# Patient Record
Sex: Female | Born: 1960
Health system: Southern US, Community
[De-identification: ages and names within clinical notes are randomized; demographics above are authoritative.]

## PROBLEM LIST (undated history)

## (undated) DIAGNOSIS — Z973 Presence of spectacles and contact lenses: Secondary | ICD-10-CM

## (undated) DIAGNOSIS — R3915 Urgency of urination: Secondary | ICD-10-CM

## (undated) DIAGNOSIS — K5909 Other constipation: Secondary | ICD-10-CM

## (undated) DIAGNOSIS — R1031 Right lower quadrant pain: Secondary | ICD-10-CM

## (undated) DIAGNOSIS — K589 Irritable bowel syndrome without diarrhea: Secondary | ICD-10-CM

## (undated) DIAGNOSIS — K311 Adult hypertrophic pyloric stenosis: Secondary | ICD-10-CM

## (undated) DIAGNOSIS — Z8489 Family history of other specified conditions: Secondary | ICD-10-CM

## (undated) DIAGNOSIS — K219 Gastro-esophageal reflux disease without esophagitis: Secondary | ICD-10-CM

## (undated) DIAGNOSIS — M797 Fibromyalgia: Secondary | ICD-10-CM

## (undated) DIAGNOSIS — M1990 Unspecified osteoarthritis, unspecified site: Secondary | ICD-10-CM

## (undated) DIAGNOSIS — D649 Anemia, unspecified: Secondary | ICD-10-CM

## (undated) DIAGNOSIS — N644 Mastodynia: Secondary | ICD-10-CM

## (undated) DIAGNOSIS — R351 Nocturia: Secondary | ICD-10-CM

## (undated) DIAGNOSIS — R112 Nausea with vomiting, unspecified: Secondary | ICD-10-CM

## (undated) DIAGNOSIS — R011 Cardiac murmur, unspecified: Secondary | ICD-10-CM

## (undated) DIAGNOSIS — Z8639 Personal history of other endocrine, nutritional and metabolic disease: Secondary | ICD-10-CM

## (undated) DIAGNOSIS — K9041 Non-celiac gluten sensitivity: Secondary | ICD-10-CM

## (undated) DIAGNOSIS — R350 Frequency of micturition: Secondary | ICD-10-CM

## (undated) DIAGNOSIS — G43909 Migraine, unspecified, not intractable, without status migrainosus: Secondary | ICD-10-CM

## (undated) DIAGNOSIS — Z9889 Other specified postprocedural states: Secondary | ICD-10-CM

## (undated) DIAGNOSIS — N393 Stress incontinence (female) (male): Secondary | ICD-10-CM

## (undated) HISTORY — PX: BLADDER SUSPENSION: SHX72

## (undated) HISTORY — DX: Adult hypertrophic pyloric stenosis: K31.1

## (undated) HISTORY — DX: Gastro-esophageal reflux disease without esophagitis: K21.9

## (undated) HISTORY — DX: Non-celiac gluten sensitivity: K90.41

## (undated) HISTORY — DX: Right lower quadrant pain: R10.31

## (undated) HISTORY — DX: Cardiac murmur, unspecified: R01.1

## (undated) HISTORY — DX: Mastodynia: N64.4

## (undated) HISTORY — DX: Unspecified osteoarthritis, unspecified site: M19.90

## (undated) HISTORY — PX: TOTAL ABDOMINAL HYSTERECTOMY W/ BILATERAL SALPINGOOPHORECTOMY: SHX83

## (undated) HISTORY — DX: Fibromyalgia: M79.7

## (undated) HISTORY — DX: Irritable bowel syndrome, unspecified: K58.9

## (undated) HISTORY — DX: Anemia, unspecified: D64.9

## (undated) HISTORY — PX: COLONOSCOPY WITH PROPOFOL: SHX5780

---

## 1998-11-11 HISTORY — PX: BREAST CYST EXCISION: SHX579

## 2002-11-11 HISTORY — PX: CHOLECYSTECTOMY OPEN: SUR202

## 2007-09-14 ENCOUNTER — Emergency Department (HOSPITAL_COMMUNITY): Admission: EM | Admit: 2007-09-14 | Discharge: 2007-09-14 | Payer: Self-pay | Admitting: Emergency Medicine

## 2008-03-29 ENCOUNTER — Emergency Department (HOSPITAL_COMMUNITY): Admission: EM | Admit: 2008-03-29 | Discharge: 2008-03-29 | Payer: Self-pay | Admitting: Emergency Medicine

## 2010-05-16 ENCOUNTER — Emergency Department (HOSPITAL_COMMUNITY): Admission: EM | Admit: 2010-05-16 | Discharge: 2010-05-16 | Payer: Self-pay | Admitting: Emergency Medicine

## 2010-05-28 ENCOUNTER — Ambulatory Visit (HOSPITAL_COMMUNITY): Admission: RE | Admit: 2010-05-28 | Discharge: 2010-05-28 | Payer: Self-pay | Admitting: Family Medicine

## 2010-05-28 LAB — HM MAMMOGRAPHY: HM Mammogram: NEGATIVE

## 2010-05-30 ENCOUNTER — Ambulatory Visit: Payer: Self-pay | Admitting: Family Medicine

## 2011-01-27 LAB — RAPID STREP SCREEN (MED CTR MEBANE ONLY): Streptococcus, Group A Screen (Direct): NEGATIVE

## 2011-01-27 LAB — STREP A DNA PROBE

## 2011-04-06 ENCOUNTER — Emergency Department (HOSPITAL_COMMUNITY)
Admission: EM | Admit: 2011-04-06 | Discharge: 2011-04-06 | Disposition: A | Discharge status: Home / Self Care | Payer: Self-pay | Attending: Emergency Medicine | Admitting: Emergency Medicine

## 2011-04-06 ENCOUNTER — Emergency Department (HOSPITAL_COMMUNITY): Payer: Self-pay

## 2011-04-06 DIAGNOSIS — X58XXXA Exposure to other specified factors, initial encounter: Secondary | ICD-10-CM | POA: Insufficient documentation

## 2011-04-06 DIAGNOSIS — IMO0002 Reserved for concepts with insufficient information to code with codable children: Secondary | ICD-10-CM | POA: Insufficient documentation

## 2011-04-06 DIAGNOSIS — G43909 Migraine, unspecified, not intractable, without status migrainosus: Secondary | ICD-10-CM | POA: Insufficient documentation

## 2011-05-22 ENCOUNTER — Encounter: Payer: Self-pay | Admitting: Family Medicine

## 2011-06-17 ENCOUNTER — Other Ambulatory Visit: Payer: Self-pay | Admitting: Family Medicine

## 2011-06-17 DIAGNOSIS — Z1231 Encounter for screening mammogram for malignant neoplasm of breast: Secondary | ICD-10-CM

## 2011-06-20 ENCOUNTER — Ambulatory Visit (HOSPITAL_COMMUNITY): Payer: Self-pay

## 2011-06-21 ENCOUNTER — Ambulatory Visit (HOSPITAL_COMMUNITY)
Admission: RE | Admit: 2011-06-21 | Discharge: 2011-06-21 | Disposition: A | Discharge status: Home / Self Care | Payer: Self-pay | Source: Ambulatory Visit | Attending: Family Medicine | Admitting: Family Medicine

## 2011-06-21 DIAGNOSIS — Z1231 Encounter for screening mammogram for malignant neoplasm of breast: Secondary | ICD-10-CM | POA: Insufficient documentation

## 2011-08-07 LAB — URINALYSIS, ROUTINE W REFLEX MICROSCOPIC
Bilirubin Urine: NEGATIVE
Ketones, ur: NEGATIVE
Protein, ur: NEGATIVE

## 2011-08-20 LAB — DIFFERENTIAL
Basophils Absolute: 0.1
Basophils Relative: 1
Eosinophils Relative: 1
Monocytes Absolute: 0.5
Monocytes Relative: 4

## 2011-08-20 LAB — BASIC METABOLIC PANEL
BUN: 14
CO2: 30
Calcium: 10.5
Creatinine, Ser: 0.72
GFR calc Af Amer: >60
GFR calc non Af Amer: >60
Glucose, Bld: 95
Sodium: 142

## 2011-08-20 LAB — CBC
HCT: 38.3
MCV: 87.5
RBC: 4.38
RDW: 13.9
WBC: 12.1 — ABNORMAL HIGH

## 2011-08-20 LAB — URINALYSIS, ROUTINE W REFLEX MICROSCOPIC
Bilirubin Urine: NEGATIVE
Specific Gravity, Urine: 1.013
pH: 7

## 2011-08-20 LAB — URINE CULTURE

## 2011-09-03 ENCOUNTER — Encounter: Payer: Self-pay | Admitting: Family Medicine

## 2011-09-03 ENCOUNTER — Ambulatory Visit (INDEPENDENT_AMBULATORY_CARE_PROVIDER_SITE_OTHER): Payer: Self-pay | Admitting: Family Medicine

## 2011-09-03 ENCOUNTER — Ambulatory Visit (HOSPITAL_COMMUNITY)
Admission: RE | Admit: 2011-09-03 | Discharge: 2011-09-03 | Disposition: A | Discharge status: Home / Self Care | Payer: Self-pay | Source: Ambulatory Visit | Attending: Family Medicine | Admitting: Family Medicine

## 2011-09-03 ENCOUNTER — Other Ambulatory Visit: Payer: Self-pay | Admitting: Family Medicine

## 2011-09-03 VITALS — BP 130/90 | HR 69 | Ht <= 58 in | Wt 143.0 lb

## 2011-09-03 DIAGNOSIS — R1031 Right lower quadrant pain: Secondary | ICD-10-CM

## 2011-09-03 DIAGNOSIS — K5900 Constipation, unspecified: Secondary | ICD-10-CM | POA: Insufficient documentation

## 2011-09-03 HISTORY — DX: Right lower quadrant pain: R10.31

## 2011-09-03 LAB — COMPREHENSIVE METABOLIC PANEL
ALT: 18 U/L (ref 0–35)
AST: 23 U/L (ref 0–37)
BUN: 12 mg/dL (ref 6–23)
Glucose, Bld: 93 mg/dL (ref 70–99)
Sodium: 142 mEq/L (ref 135–145)
Total Bilirubin: 0.4 mg/dL (ref 0.3–1.2)
Total Protein: 7.6 g/dL (ref 6.0–8.3)

## 2011-09-03 LAB — CBC WITH DIFFERENTIAL/PLATELET
Basophils Absolute: 0 10*3/uL (ref 0.0–0.1)
HCT: 40.6 % (ref 36.0–46.0)
Hemoglobin: 13.7 g/dL (ref 12.0–15.0)
Neutro Abs: 2.3 10*3/uL (ref 1.7–7.7)
Neutrophils Relative %: 42 % — ABNORMAL LOW (ref 43–77)
Platelets: 336 10*3/uL (ref 150–400)
RBC: 4.57 MIL/uL (ref 3.87–5.11)
RDW: 13.6 % (ref 11.5–15.5)
WBC: 5.5 10*3/uL (ref 4.0–10.5)

## 2011-09-03 MED ORDER — LACTULOSE 10 GM/15ML PO SOLN: 20.0000 g | Freq: Three times a day (TID) | ORAL | Status: AC

## 2011-09-03 NOTE — Assessment & Plan Note (Signed)
Patient with onset of diarrhea and then nausea and loss of appetite that seemed to be precipitated by a medial with chicken the patient has felt mildly ill since that time. Most likely result of viral gastroenteritis versus mild food poisoning. The patient's abdominal exam, while presenting with some right lower cartilage tenderness, is fairly benign. I am evaluating her today with a CBC with differential as well as a metabolic panel and a KUB. She has been 3 days without having a bowel movement and I believe that if we can help her with this she will likely have resolution of her abdominal pain. I discussed with her the use of fleets enema at home as well as lactulose. She has used milk of magnesia on one occasion since this episode of illness without a bowel movement. She is given instructions about her reasons to present to the ED or to call our practice, which include onset of fevers and or chills, intractable vomiting, or markedly worsening at sharp abdominal pain. I will call the patient at the telephone number she gives me [phone] with results of her studies, and we will plan followup at that point. 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 pylori. She will let most likely require some form of endoscopic followup.

## 2011-09-03 NOTE — Patient Instructions (Addendum)
Fue un Research officer, trade union.  Me alegro tenerle como paciente!  Para Environmental health practitioner y el estrenimiento, estoy mandando hacer algunos laboratorios, tambien una placa del abdomen.  Le llamo al [phone] [facility] [place].    Mande' una receta para Watt Climes Doreen Beam) a la Corporate investment banker de [employer].  Por favor tome 1 a 2 cucharadas (TABLESPOONS) cada 8 horas hasta que haga del bano.   Tambien puede usar un lavado (Fleets enema) sin receta, para ayudarle a ir al bano.   Si el dolor abdominal se agudiza o si tiene vomitos constantes o fiebres, recomiendo que vaya al departamento de emergencia o que llame a nuestro consultorio [phone].  Quiero verle de nuevo en 4 semanas, o antes si se le hacenecesario.  MAKE FOLLOW UP APPOINTMENT WITH DR Mauricio Po IN 4 TO 5 WEEKS.

## 2011-09-03 NOTE — Progress Notes (Signed)
Subjective:    Patient ID: Terri Montgomery, female    DOB: [birth year], 51 y.o.   MRN: [id number]  HPI visit conducted in Spanish. The patient is new to our practice, who comes to establish care as well as evaluated for abdominal pain. The patient reports that she had a long period of recurrent bouts of abdominal pain when living in [place] between the years of 1999 and 2006. She reports that she was hospitalized many times in that interval and had been referred to a surgeon for an intestinal operation. She does not know the nature of the surgery that she was recommended to have and her pain seems to improve and resolve with dietary changes, probiotics, and regular physical exercise.  Today she reports that she has had a waxing and waning abdominal pain in her right lower car drink for approximately the past month. The pain has become much worse in the past 3-4 days. Beginning on Friday, 19 October after eating some chicken, she began to experience this pain in her right lower car trip that was sharp and it did not radiate. She continued with diarrhea on that Friday and Saturday, October 20. Her stools were somewhat red in color at that time, which she had attributed to eating beets. She reports some subjective fever and decreased appetite since the 20th she has not had a bowel movement in about 3 days, and reports increasingly worsening nausea without vomiting. She has had no objective fever. She denies dysuria or polyuria, she denies hematuria. 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  Past medical history: The patient had been diagnosed with H. pylori and given treatment on multiple occasions in the past. She had EGD and colonoscopies done multiple times in the period between 1999 2006 in [place], and she does not know the exact dates of her most recent  endoscopic studies there.  Social history: The patient lives with her husband and moved to [place] in 2008 from [place]. She has never been a smoker nor does she drink alcohol. She tries to eat a very healthy diet and she has a laminated red meat. She eats a large amount of vegetables and also will eat chicken and fish and takes probiotics. She takes no prescription medications at this time.  Family history: The patient's mother has diabetes hypertension and hypercholesterolemia as well as heart disease. The patient's father had some form of prostate illness that is not prostate cancer. There are no family members with known cancers. The patient had a daughter who died at age 41 of a "heart attack" and was found on autopsy to have a colonic obstruction.  Review of Systems she reports some occasional musculoskeletal type of discomfort in her sternum that is associated with weather changes. She denies any diaphoresis or nausea or any radiation of pain to the chart left arm. She denies any personal history of heart disease or hypertension     Objective:   Physical Exam Well-appearing, pleasant and in no apparent distress able to in the leg without any difficulty and stand up straight without apparent abdominal distress. HEENT. Mucous membranes are moist neck is supple no cervical adenopathy is noted. Clear oropharynx. Heart: Regular rate and rhythm no extra sounds or murmurs are noted. Lungs: Clear to auscultation bilaterally, no wheezes rales or rhonchi heard. Abdomen: Soft, no masses noted, no guarding. The patient  does have some tenderness in her right lower car drink that is not worsened or ameliorated by flexion of the hip negative obturator sign. The patient does have a normal bowel sounds on auscultation. There is no organomegaly noted. Extremities: Patient has no edema in her lower extremities        Assessment & Plan:

## 2011-09-04 ENCOUNTER — Encounter: Payer: Self-pay | Admitting: Family Medicine

## 2011-09-05 LAB — H. PYLORI BREATH TEST: H. pylori UBiT: NOT DETECTED

## 2011-10-23 ENCOUNTER — Encounter: Payer: Self-pay | Admitting: Family Medicine

## 2011-10-23 ENCOUNTER — Ambulatory Visit (INDEPENDENT_AMBULATORY_CARE_PROVIDER_SITE_OTHER): Payer: Self-pay | Admitting: Family Medicine

## 2011-10-23 DIAGNOSIS — R5383 Other fatigue: Secondary | ICD-10-CM | POA: Insufficient documentation

## 2011-10-23 DIAGNOSIS — N644 Mastodynia: Secondary | ICD-10-CM

## 2011-10-23 DIAGNOSIS — N6452 Nipple discharge: Secondary | ICD-10-CM

## 2011-10-23 DIAGNOSIS — N6459 Other signs and symptoms in breast: Secondary | ICD-10-CM

## 2011-10-23 DIAGNOSIS — R5381 Other malaise: Secondary | ICD-10-CM

## 2011-10-23 HISTORY — DX: Mastodynia: N64.4

## 2011-10-23 LAB — TSH: TSH: 1.645 u[IU]/mL (ref 0.350–4.500)

## 2011-10-23 LAB — CBC
Hemoglobin: 13.5 g/dL (ref 12.0–15.0)
Platelets: 322 10*3/uL (ref 150–400)
RBC: 4.44 MIL/uL (ref 3.87–5.11)
WBC: 6 10*3/uL (ref 4.0–10.5)

## 2011-10-23 LAB — PROLACTIN: Prolactin: 5.5 ng/mL

## 2011-10-23 MED ORDER — LORATADINE 10 MG PO TABS: 10.0000 mg | ORAL_TABLET | Freq: Every day | ORAL | Status: DC

## 2011-10-23 NOTE — Patient Instructions (Signed)
Fue un Research officer, trade union.  Estamos haciendo unos cuantos estudios (mamografia bilateral, Proofreader) y examenes de sangre para tratar de determinar la causa para el desecho y la comezon en los senos.   Por ahora puede tomar un antihistaminico (loratadine 10mg ) una tableta por dia, para la comezon.  tambien puede aplicar hydrocortisone 1% cream (over the counter) una a [employer] por dia, al area [employer].   Le llamo con los [employer].  Si no hay mejoria ni hallazgos que nos ayuden con el diagnostico, entonces pienso hacer una biopsia aqui en la oficina de la piel enrojecida cuando regrese en enero.  FOLLOW UP WITH DR Mauricio Po IN  January 2013.

## 2011-10-23 NOTE — Assessment & Plan Note (Signed)
Patient able to express discharge from R nipple during visit.  Guaiac testing on yellow fluid is negative.  Associated with pruritic red patches along bilateral breasts, and nipples, as well as exquisitely sensitive/tender breasts (moreso on the L than R).  H/o TAH (?BSO) in [place] 10 yrs ago for "fibromas", no malignant indications.  No family hx breast cancer.  Will send for bilat diagnostic mammogram and [place]; labs to evaluation for galactorrhea (prolactin, TSH, as well as FSH).  Low-dose topical steroid for possible eczema as cause of pruritus.  If studies negative and no clear etiology is found, then to perform skin biopsy on non-areolar skin when she comes back in January.  No true diplopia or visual field defect noted on today's exam; will consider more thorough eval of visual fields if prolactinoma is suspected after testing.

## 2011-10-23 NOTE — Progress Notes (Signed)
Addended by: Jennette Bill on: 10/23/2011 03:05 PM   Modules accepted: Orders

## 2011-10-23 NOTE — Progress Notes (Signed)
  Subjective:    Patient ID: Terri Montgomery, female    DOB: [birth year], 50 y.o.   MRN: [id number]  HPI    Review of Systems     Objective:   Physical Exam Well appearing, no apparent distress. HEENT NEck supple,. No neck masses or cervical adenopathy.  BREASTS: Nipples (bilaterally) with erythema, slight flaking.  Macules (3-19mm diameter) of nonraised erythema noted along both breasts, overlapping areolar and non-areolar skin.  No discreet mass noted on bimanual exam; exquisite tenderness along L breast ext/inferior quadrant. No axillary adenopathy noted.  Patient expresses small amount yellow nonpurulent fluid from R breast; guaiac testing is negative.       Assessment & Plan:

## 2011-10-24 ENCOUNTER — Telehealth: Payer: Self-pay | Admitting: *Deleted

## 2011-10-24 NOTE — Telephone Encounter (Signed)
Called and informed patient of appt for digital mammogram and U/S with the breast center on 11/07/11 at 8 am.Terri Montgomery, Rodena Medin

## 2011-10-28 ENCOUNTER — Encounter: Payer: Self-pay | Admitting: Family Medicine

## 2011-10-31 ENCOUNTER — Encounter: Payer: Self-pay | Admitting: Family Medicine

## 2011-10-31 ENCOUNTER — Ambulatory Visit (INDEPENDENT_AMBULATORY_CARE_PROVIDER_SITE_OTHER): Payer: Self-pay | Admitting: Family Medicine

## 2011-10-31 VITALS — BP 114/75 | HR 60 | Temp 98.0°F | Ht 62.0 in | Wt 146.0 lb

## 2011-10-31 DIAGNOSIS — R300 Dysuria: Secondary | ICD-10-CM

## 2011-10-31 DIAGNOSIS — R6889 Other general symptoms and signs: Secondary | ICD-10-CM

## 2011-10-31 LAB — POCT URINALYSIS DIPSTICK
Glucose, UA: NEGATIVE
Leukocytes, UA: NEGATIVE
Nitrite, UA: NEGATIVE
Protein, UA: NEGATIVE
Urobilinogen, UA: 0.2

## 2011-10-31 LAB — POCT UA - MICROSCOPIC ONLY

## 2011-10-31 MED ORDER — HYDROCODONE-ACETAMINOPHEN 5-500 MG PO TABS: 1.0000 | ORAL_TABLET | ORAL | Status: DC | PRN

## 2011-10-31 NOTE — Assessment & Plan Note (Signed)
Patient with symptoms consistent with flu. Normal O2 sat, no fever. Asked patient to try to maintain hydration, take Tylenol for pain. Patient is having a lot of pain in her chest back and legs and she requests pain medicine. I gave her a limited amount of Vicodin for this. I advised her to return if her breathing worsens or she is not getting better by Monday.

## 2011-10-31 NOTE — Assessment & Plan Note (Signed)
Mild dysuria today. We'll check a urine to make sure no infection.

## 2011-10-31 NOTE — Progress Notes (Signed)
  Subjective:    Patient ID: Terri Montgomery, female    DOB: Oct 09, 1961, 50 y.o.   MRN: [id number]  HPI Patient presents today with 5 days body aches, chest congestion, fever. She states that she feels a little bit short of breath and has some pleuritic chest pain. She denies cough. She reports sore throat. She states that she has considerable pain in her legs, back, chest. She had some dysuria this morning.   Review of Systems    denies diarrhea, nausea, vomiting Objective:   Physical Exam O2 sat 100% GEN-fatigue, uncomfortable appearing Lungs-clear to auscultation, no wheeze no rhonchi no crackles Heart-normal S1-S2 no murmur Abdomen-nontender nondistended Extremities-trace pitting edema bilateral ankles. Mild tenderness in the left calf. Calves are symmetric at 33.5 cm bilaterally     Assessment & Plan:

## 2011-10-31 NOTE — Patient Instructions (Addendum)
I am giving you some pain medicine today. If things are getting worse and not better over the next several days, I want you to make sure to come back. If your breathing gets worse I would also like you to come back I would like you to take Tylenol for the next couple of days. If you take a Vicodin, that has 500 mg of Tylenol and it You could take one or 2 Vicodin per day and take 650 mg of Tylenol morning and night Please try to stay hydrated I will call you if anything shows up in your urine  Influenza A (H1N1) H1N1 formerly called "swine flu" is a new influenza virus causing sickness in people. The H1N1 virus is different from seasonal influenza viruses. However, the H1N1 symptoms are similar to seasonal influenza and it is spread from person to person. You may be at higher risk for serious problems if you have underlying serious medical conditions. The CDC and the [employer] are following reported cases around the world. CAUSES    The flu is thought to spread mainly person-to-person through coughing or sneezing of infected people.     A person may become infected by touching something with the virus on it and then touching their mouth or nose.  SYMPTOMS    Fever.     Headache.    Tiredness.    Cough.    Sore throat.     Runny or stuffy nose.     Body aches.     Diarrhea and vomiting  These symptoms are referred to as "flu-like symptoms." A lot of different illnesses, including the common cold, may have similar symptoms. DIAGNOSIS    There are tests that can tell if you have the H1N1 virus.     Confirmed cases of H1N1 will be reported to the state or local health department.     A doctor's exam may be needed to tell whether you have an infection that is a complication of the flu.  HOME CARE INSTRUCTIONS    Stay informed. Visit the [facility] website for current recommendations. Visit [employer]. You may also call 1-800-CDC-INFO (503-011-8919).      Get help early if you develop any of the above symptoms.     If you are at high risk from complications of the flu, talk to your caregiver as soon as you develop flu-like symptoms. Those at higher risk for complications include:     People 65 years or older.     People with chronic medical conditions.     Pregnant women.     Young children.     Your caregiver may recommend antiviral medicine to help treat the flu.     If you get the flu, get plenty of rest, drink enough water and fluids to keep your urine clear or pale yellow, and avoid using alcohol or tobacco.     You may take over-the-counter medicine to relieve the symptoms of the flu if your caregiver approves. (Never give aspirin to children or teenagers who have flu-like symptoms, particularly fever).  TREATMENT   If you do get sick, antiviral drugs are available. These drugs can make your illness milder and make you feel better faster. Treatment should start soon after illness starts. It is only effective if taken within the first day of becoming ill. Only your caregiver can prescribe antiviral medication.   PREVENTION    Cover your nose and mouth with a tissue or your arm when you  cough or sneeze. Throw the tissue away.     Wash your hands often with soap and warm water, especially after you cough or sneeze. Alcohol-based cleaners are also effective against germs.     Avoid touching your eyes, nose or mouth. This is one way germs spread.     Try to avoid contact with sick people. Follow public health advice regarding school closures. Avoid crowds.     Stay home if you get sick. Limit contact with others to keep from infecting them. People infected with the H1N1 virus may be able to infect others anywhere from 1 day before feeling sick to 5-7 days after getting flu symptoms.     An H1N1 vaccine is available to help protect against the virus. In addition to the H1N1 vaccine, you will need to be vaccinated for seasonal  influenza. The H1N1 and seasonal vaccines may be given on the same day. The CDC especially recommends the H1N1 vaccine for:     Pregnant women.     People who live with or care for children younger than 65 months of age.     Health care and emergency services personnel.     Persons between the ages of 25 months through 46 years of age.     People from ages 32 through 6 years who are at higher risk for H1N1 because of chronic health disorders or immune system problems.  FACEMASKS In community and home settings, the use of facemasks and N95 respirators are not normally recommended. In certain circumstances, a facemask or N95 respirator may be used for persons at increased risk of severe illness from influenza. Your caregiver can give additional recommendations for facemask use. IN CHILDREN, EMERGENCY WARNING SIGNS THAT NEED URGENT MEDICAL CARE:  Fast breathing or trouble breathing.     Bluish skin color.     Not drinking enough fluids.     Not waking up or not interacting normally.     Being so fussy that the child does not want to be held.     Your child has an oral temperature above 102 F (38.9 C), not controlled by medicine.     Your baby is older than 3 months with a rectal temperature of 102 F (38.9 C) or higher.     Your baby is 18 months old or younger with a rectal temperature of 100.4 F (38 C) or higher.     Flu-like symptoms improve but then return with fever and worse cough.  IN ADULTS, EMERGENCY WARNING SIGNS THAT NEED URGENT MEDICAL CARE:  Difficulty breathing or shortness of breath.     Pain or pressure in the chest or abdomen.     Sudden dizziness.     Confusion.    Severe or persistent vomiting.     Bluish color.     You have a oral temperature above 102 F (38.9 C), not controlled by medicine.     Flu-like symptoms improve but return with fever and worse cough.  SEEK IMMEDIATE MEDICAL CARE IF:   You or someone you know is experiencing any of the  above symptoms. When you arrive at the emergency center, report that you think you have the flu. You may be asked to wear a mask and/or sit in a secluded area to protect others from getting sick. MAKE SURE YOU:    Understand these instructions.     Will watch your condition.     Will get help right away if you are not  doing well or get worse.  Some of this information courtesy of the CDC.  Document Released: 04/15/2008 Document Revised: 07/10/2011 Document Reviewed: 04/15/2008 [facility] Patient Information 2012 [place], [place].

## 2011-11-07 ENCOUNTER — Other Ambulatory Visit: Payer: Self-pay

## 2011-11-20 ENCOUNTER — Other Ambulatory Visit: Payer: Self-pay | Admitting: Family Medicine

## 2011-11-20 ENCOUNTER — Ambulatory Visit
Admission: RE | Admit: 2011-11-20 | Discharge: 2011-11-20 | Disposition: A | Discharge status: Home / Self Care | Payer: Self-pay | Source: Ambulatory Visit | Attending: Family Medicine | Admitting: Family Medicine

## 2011-11-20 DIAGNOSIS — N6452 Nipple discharge: Secondary | ICD-10-CM

## 2011-11-22 ENCOUNTER — Telehealth: Payer: Self-pay | Admitting: Family Medicine

## 2011-11-22 NOTE — Telephone Encounter (Signed)
Called patient to give results of diagnostic mammogram which is negative.  She reports continued redness; the itching is much better than before.  Feels out of sorts, somewhat dizzy.  Previously on bioidentical hormones, felt better when she was on these. To schedule appointment for follow up to discuss.

## 2011-12-02 ENCOUNTER — Telehealth: Payer: Self-pay | Admitting: *Deleted

## 2011-12-02 ENCOUNTER — Encounter: Payer: Self-pay | Admitting: Family Medicine

## 2011-12-02 ENCOUNTER — Ambulatory Visit (INDEPENDENT_AMBULATORY_CARE_PROVIDER_SITE_OTHER): Payer: Self-pay | Admitting: Family Medicine

## 2011-12-02 VITALS — BP 123/86 | HR 70 | Temp 98.3°F | Ht 62.0 in | Wt 144.8 lb

## 2011-12-02 DIAGNOSIS — R5381 Other malaise: Secondary | ICD-10-CM

## 2011-12-02 DIAGNOSIS — R5383 Other fatigue: Secondary | ICD-10-CM

## 2011-12-02 DIAGNOSIS — R0789 Other chest pain: Secondary | ICD-10-CM

## 2011-12-02 LAB — CBC WITH DIFFERENTIAL/PLATELET
Basophils Absolute: 0 10*3/uL (ref 0.0–0.1)
Eosinophils Relative: 1 % (ref 0–5)
HCT: 40.2 % (ref 36.0–46.0)
Hemoglobin: 13.6 g/dL (ref 12.0–15.0)
Lymphocytes Relative: 45 % (ref 12–46)
Lymphs Abs: 3.3 10*3/uL (ref 0.7–4.0)
MCV: 88.2 fL (ref 78.0–100.0)
Monocytes Absolute: 0.5 10*3/uL (ref 0.1–1.0)
Monocytes Relative: 7 % (ref 3–12)
Neutro Abs: 3.6 10*3/uL (ref 1.7–7.7)
RDW: 13.4 % (ref 11.5–15.5)
WBC: 7.4 10*3/uL (ref 4.0–10.5)

## 2011-12-02 MED ORDER — TRAMADOL HCL 50 MG PO TABS: 50.0000 mg | ORAL_TABLET | Freq: Three times a day (TID) | ORAL | Status: AC | PRN

## 2011-12-02 NOTE — Telephone Encounter (Signed)
Patient calls reporting on 01/18 in AM she developed chest pressure. Has continued over weekend with this off and on. Has cough with clear sputum, nausea, chills and sweating about every 3 hours she states.  State she has not had fever. Denies chest pressure now. Consulted with Dr. Deirdre Priest and he advises for patient to come in to be seen here this afternoon. Appointment scheduled.

## 2011-12-02 NOTE — Patient Instructions (Signed)
We are going to test a great deal of blood work today. It will come back in the next few days. Once it's back I will give you a call In the meantime take the Tramadol 50 mg every 6 hours as needed for pain.

## 2011-12-03 LAB — COMPREHENSIVE METABOLIC PANEL
ALT: 23 U/L (ref 0–35)
BUN: 10 mg/dL (ref 6–23)
CO2: 29 mEq/L (ref 19–32)
Calcium: 11.2 mg/dL — ABNORMAL HIGH (ref 8.4–10.5)
Chloride: 105 mEq/L (ref 96–112)
Creat: 0.69 mg/dL (ref 0.50–1.10)
Total Bilirubin: 0.3 mg/dL (ref 0.3–1.2)

## 2011-12-03 LAB — VITAMIN D 25 HYDROXY (VIT D DEFICIENCY, FRACTURES): Vit D, 25-Hydroxy: 14 ng/mL — ABNORMAL LOW (ref 30–89)

## 2011-12-03 LAB — PTH, INTACT AND CALCIUM
Calcium, Total (PTH): 11.2 mg/dL — ABNORMAL HIGH (ref 8.4–10.5)
PTH: 272.2 pg/mL — ABNORMAL HIGH (ref 14.0–72.0)

## 2011-12-03 LAB — C-REACTIVE PROTEIN: CRP: 0.06 mg/dL (ref <0.60)

## 2011-12-03 LAB — GAMMA GT: GGT: 15 U/L (ref 7–51)

## 2011-12-04 ENCOUNTER — Telehealth: Payer: Self-pay | Admitting: Family Medicine

## 2011-12-04 DIAGNOSIS — R0789 Other chest pain: Secondary | ICD-10-CM | POA: Insufficient documentation

## 2011-12-04 NOTE — Telephone Encounter (Signed)
Called and discussed results of lab work with patient.  Recommended next step as referral to general surgery. Patient states she has been talking with her mom and parathyroid problems run in her family.  Will place referral now

## 2011-12-04 NOTE — Assessment & Plan Note (Signed)
Not typical cardiac chest pain. Has not recurred after the one episode.  Has history of chest discomfort as well as abdominal discomfort in past. Reassured patient. Perhaps fibromyalgia, although I think something else is going on after reviewing her labs.   Will call with further lab results.

## 2011-12-04 NOTE — Progress Notes (Signed)
  Subjective:    Patient ID: Terri Montgomery, female    DOB: 07-May-1961, 51 y.o.   MRN: [id number]  HPI 1.  Malaise:  Patient has been experiencing malaise for past several months but feels it is acutely worsening in past several weeks to days.  Has been seen at [facility] several times recently for general flu-like symptoms, chest pains, cold sweats.  Although she is no longer having any URI symptoms she does continue to have vague abdominal complaints (sometimes diffuse cramping pain), nausea without vomiting, cold sweats during day.  Complains of chills, no fevers.  Cold sweats during day.  Fatigue persistent for past 3 months at least.    2.  Chest pressure:  Had episode of "pressure" in her chest Saturday evening while trying to fall asleep.  States she felt pressure that lasted 1-2 minutes and then resolved.  No further episodes.  No nausea, vomiting, diaphoresis, shortness of breath at that time.  She does endorse dyspnea on exertion when walking for distance, however no chest pain on exertion.  Had eaten about 2 hours earlier.  She is worried it is related to her fatigue symptoms.     Review of Systems See HPI above for review of systems.       Objective:   Physical Exam Gen:  Patient sitting on exam table, appears worried, fatigued HEENT:  [place]/AT.  EOMI, PERRL.  MMM, tonsils non-erythematous, non-edematous.  External ears WNL, Bilateral TM's normal without retraction, redness or bulging.  Neck:  No thyromegaly or masses palpated Cardiac:  Regular rate and rhythm without murmur auscultated.  Good S1/S2. Pulm:  Clear to auscultation bilaterally with good air movement.  No wheezes or rales noted.   Abd:  Soft/nontender.  No masses noted.  BS good throughout. Ext:  No clubbing/cyanosis/erythema.  No edema noted bilateral lower extremities.   Psych:  Appears depressed and fatigued.   Neuro:  No gross focal deficits noted.        Assessment & Plan:

## 2011-12-04 NOTE — Assessment & Plan Note (Signed)
I am concerned this symptoms have continued to persist.   Review of labs shows persistently elevated alk phos and elevated calcium. She has had cholecystectomy; I am concerned source of alk phos is from bone. Plan to obtain GGT to further elucidate this as well as parathyroid hormone, vit D level, CMET, TSH, CBC with diff as she has history of lymphocytosis in recent past as well.  ** After further investigation, she does state she has brother who had parathyroidectomy, and then mentions she has several siblings who have been told they have trouble with their parathyroids.   Await labs.  Will call and discuss details with her.

## 2011-12-09 ENCOUNTER — Telehealth: Payer: Self-pay | Admitting: *Deleted

## 2011-12-09 NOTE — Telephone Encounter (Signed)
I received the approval from [facility] for the service that was referred to [facility] 12/18/2011 @ 2:30pm w/ Dr. Katheren Shams. Patient was informed of this appointment and was given address, phone number, and informed to arrive at least 15 min in advance. Pat[place] was informed to bring orange card and amount due if not 100% covered. Patient informed me that card covers only 80% and understands she will owe money at visit.   [facility] [address] phone number [phone]

## 2011-12-16 ENCOUNTER — Encounter (HOSPITAL_COMMUNITY): Payer: Self-pay | Admitting: Emergency Medicine

## 2011-12-16 ENCOUNTER — Other Ambulatory Visit: Payer: Self-pay

## 2011-12-16 ENCOUNTER — Encounter (HOSPITAL_COMMUNITY): Payer: Self-pay

## 2011-12-16 ENCOUNTER — Emergency Department (INDEPENDENT_AMBULATORY_CARE_PROVIDER_SITE_OTHER)
Admission: EM | Admit: 2011-12-16 | Discharge: 2011-12-16 | Disposition: A | Discharge status: Nursing Home (Includes ICF) | Payer: Commercial Managed Care - PPO | Source: Home / Self Care

## 2011-12-16 ENCOUNTER — Ambulatory Visit (INDEPENDENT_AMBULATORY_CARE_PROVIDER_SITE_OTHER): Payer: Self-pay | Admitting: Surgery

## 2011-12-16 ENCOUNTER — Emergency Department (HOSPITAL_COMMUNITY)
Admission: EM | Admit: 2011-12-16 | Discharge: 2011-12-16 | Disposition: A | Discharge status: Home / Self Care | Payer: Commercial Managed Care - PPO | Attending: Emergency Medicine | Admitting: Emergency Medicine

## 2011-12-16 DIAGNOSIS — R5381 Other malaise: Secondary | ICD-10-CM

## 2011-12-16 DIAGNOSIS — R112 Nausea with vomiting, unspecified: Secondary | ICD-10-CM | POA: Insufficient documentation

## 2011-12-16 DIAGNOSIS — R634 Abnormal weight loss: Secondary | ICD-10-CM | POA: Insufficient documentation

## 2011-12-16 DIAGNOSIS — K219 Gastro-esophageal reflux disease without esophagitis: Secondary | ICD-10-CM | POA: Insufficient documentation

## 2011-12-16 DIAGNOSIS — R109 Unspecified abdominal pain: Secondary | ICD-10-CM

## 2011-12-16 DIAGNOSIS — R110 Nausea: Secondary | ICD-10-CM

## 2011-12-16 DIAGNOSIS — R5383 Other fatigue: Secondary | ICD-10-CM | POA: Insufficient documentation

## 2011-12-16 DIAGNOSIS — R079 Chest pain, unspecified: Secondary | ICD-10-CM | POA: Insufficient documentation

## 2011-12-16 DIAGNOSIS — R0789 Other chest pain: Secondary | ICD-10-CM

## 2011-12-16 DIAGNOSIS — R531 Weakness: Secondary | ICD-10-CM

## 2011-12-16 DIAGNOSIS — R197 Diarrhea, unspecified: Secondary | ICD-10-CM | POA: Insufficient documentation

## 2011-12-16 LAB — URINALYSIS, ROUTINE W REFLEX MICROSCOPIC
Bilirubin Urine: NEGATIVE
Glucose, UA: NEGATIVE mg/dL
Specific Gravity, Urine: 1.006 (ref 1.005–1.030)

## 2011-12-16 LAB — URINE MICROSCOPIC-ADD ON

## 2011-12-16 LAB — COMPREHENSIVE METABOLIC PANEL
ALT: 14 U/L (ref 0–35)
AST: 20 U/L (ref 0–37)
Albumin: 4 g/dL (ref 3.5–5.2)
CO2: 25 mEq/L (ref 19–32)
Calcium: 11.1 mg/dL — ABNORMAL HIGH (ref 8.4–10.5)
Creatinine, Ser: 0.63 mg/dL (ref 0.50–1.10)
GFR calc non Af Amer: >90 mL/min (ref >90)
Sodium: 141 mEq/L (ref 135–145)
Total Protein: 6.7 g/dL (ref 6.0–8.3)

## 2011-12-16 LAB — CBC
HCT: 37.5 % (ref 36.0–46.0)
Hemoglobin: 12.9 g/dL (ref 12.0–15.0)
MCH: 29.9 pg (ref 26.0–34.0)
MCHC: 34.4 g/dL (ref 30.0–36.0)
RBC: 4.32 MIL/uL (ref 3.87–5.11)

## 2011-12-16 LAB — DIFFERENTIAL
Eosinophils Absolute: 0.1 10*3/uL (ref 0.0–0.7)
Lymphs Abs: 3.9 10*3/uL (ref 0.7–4.0)
Monocytes Absolute: 0.5 10*3/uL (ref 0.1–1.0)
Monocytes Relative: 6 % (ref 3–12)
Neutro Abs: 3.7 10*3/uL (ref 1.7–7.7)
Neutrophils Relative %: 45 % (ref 43–77)

## 2011-12-16 MED ORDER — SODIUM CHLORIDE 0.9 % IV BOLUS (SEPSIS)
1000.0000 mL | Freq: Once | INTRAVENOUS | Status: AC
  Administered 2011-12-16: 1000 mL via INTRAVENOUS

## 2011-12-16 MED ORDER — DIPHENOXYLATE-ATROPINE 2.5-0.025 MG PO TABS: 1.0000 | ORAL_TABLET | Freq: Four times a day (QID) | ORAL | Status: DC | PRN

## 2011-12-16 MED ORDER — ONDANSETRON HCL 4 MG/2ML IJ SOLN
4.0000 mg | Freq: Once | INTRAMUSCULAR | Status: AC
  Administered 2011-12-16: 4 mg via INTRAVENOUS

## 2011-12-16 MED ORDER — METOCLOPRAMIDE HCL 10 MG PO TABS: ORAL_TABLET | ORAL | Status: DC

## 2011-12-16 MED ORDER — ONDANSETRON HCL 4 MG/2ML IJ SOLN
INTRAMUSCULAR | Status: AC
  Filled 2011-12-16: qty 2

## 2011-12-16 MED ORDER — MORPHINE SULFATE 4 MG/ML IJ SOLN
4.0000 mg | Freq: Once | INTRAMUSCULAR | Status: AC
  Administered 2011-12-16: 4 mg via INTRAVENOUS
  Filled 2011-12-16: qty 1

## 2011-12-16 MED ORDER — SODIUM CHLORIDE 0.9 % IV BOLUS (SEPSIS)
1000.0000 mL | Freq: Once | INTRAVENOUS | Status: DC
  Administered 2011-12-16: 1000 mL via INTRAVENOUS

## 2011-12-16 MED ORDER — GLYCOPYRROLATE 0.2 MG/ML IJ SOLN
0.2000 mg | Freq: Once | INTRAMUSCULAR | Status: AC
  Administered 2011-12-16: 0.2 mg via INTRAVENOUS
  Filled 2011-12-16 (×2): qty 1

## 2011-12-16 MED ORDER — ONDANSETRON HCL 4 MG/2ML IJ SOLN
4.0000 mg | Freq: Once | INTRAMUSCULAR | Status: AC
  Administered 2011-12-16: 4 mg via INTRAVENOUS
  Filled 2011-12-16: qty 2

## 2011-12-16 NOTE — ED Notes (Signed)
Pharmacy called for Robinul states is in major Pyxis

## 2011-12-16 NOTE — ED Provider Notes (Signed)
Medical screening examination/treatment/procedure(s) were performed by non-physician practitioner and as supervising physician I was immediately available for consultation/collaboration.  Hillery Hunter, MD 12/16/11 [id number]

## 2011-12-16 NOTE — ED Notes (Signed)
Pt is aware that we need to collect a urine specimen. Pt complaining of pain at IV site. Nurse informed.

## 2011-12-16 NOTE — ED Provider Notes (Cosign Needed Addendum)
History     CSN: [id number]  Arrival date & time 12/16/11  1533   First MD Initiated Contact with Patient 12/16/11 1837      Chief Complaint  Patient presents with  . Chest Pain    (Consider location/radiation/quality/duration/timing/severity/associated sxs/prior treatment) Patient is a 51 y.o. female presenting with chest pain. The history is provided by the patient.  Chest Pain Primary symptoms include fatigue, abdominal pain and nausea. Pertinent negatives for primary symptoms include no fever, no shortness of breath, no cough, no vomiting and no dizziness.    the patient is a 51 year old, female, with a history of GERD, and hypertrophic pyloric stenosis.  She presents with a several month history of fatigue, nausea without vomiting and diarrhea.  She has intermittent abdominal pain.  She denies urinary tract symptoms.  She denies chest pain, cough, or shortness of breath.  She has had about a 3 pound weight loss.  She was seen by her physician in the family practice Center, and they noted that she had a elevated calcium level.  She has an appointment with a surgeon next Wednesday.  Past Medical History  Diagnosis Date  . Hypertrophic pyloric stenosis   . GERD (gastroesophageal reflux disease)     History reviewed. No pertinent past surgical history.  No family history on file.  History  Substance Use Topics  . Smoking status: Never Smoker   . Smokeless tobacco: Not on file  . Alcohol Use: No    OB History    Grav Para Term Preterm Abortions TAB SAB Ect Mult Living                  Review of Systems  Constitutional: Positive for fatigue. Negative for fever and chills.  Eyes: Negative for photophobia and visual disturbance.  Respiratory: Negative for cough and shortness of breath.   Cardiovascular: Negative for chest pain.  Gastrointestinal: Positive for nausea and abdominal pain. Negative for vomiting and diarrhea.  Genitourinary: Positive for frequency. Negative  for dysuria and hematuria.  Musculoskeletal: Negative for back pain.  Neurological: Negative for dizziness, light-headedness and headaches.  Psychiatric/Behavioral: Negative for confusion.  All other systems reviewed and are negative.    Allergies  Demerol; Imitrex; Iodine; and Penicillins  Home Medications  No current outpatient prescriptions on file.  BP 137/88  Pulse 60  Temp(Src) 97.9 F (36.6 C) (Oral)  Resp 12  SpO2 100%  Physical Exam  Vitals reviewed. Constitutional: She is oriented to person, place, and time. She appears well-developed and well-nourished.  HENT:  Head: Normocephalic and atraumatic.  Eyes: Pupils are equal, round, and reactive to light.  Neck: Normal range of motion. No thyromegaly present.  Cardiovascular: Normal rate, regular rhythm and normal heart sounds.   No murmur heard. Pulmonary/Chest: Effort normal and breath sounds normal. No respiratory distress. She has no wheezes. She has no rales.  Abdominal: Soft. She exhibits no distension and no mass. There is tenderness. There is no rebound and no guarding.       Mild diffuse abdominal tenderness without peritoneal signs  Musculoskeletal: Normal range of motion. She exhibits no edema and no tenderness.  Neurological: She is alert and oriented to person, place, and time. No cranial nerve deficit.  Skin: Skin is warm and dry. No rash noted. No erythema.  Psychiatric: She has a normal mood and affect. Her behavior is normal.    ED Course  Procedures (including critical care time) 51 year old female presents to the emergency department with fatigue  and nausea.  She hasn't had a recent change in her insulin dosages.  She did not eat lunch today, but she didn't take her insulin and oral hypoglycemic agent.  She has had a cough for the past few days, but she has not had chest pain, vomiting, diarrhea, or urinary tract symptoms.  She has a history of a cardiac stent, and [facility].   Labs  Reviewed  COMPREHENSIVE METABOLIC PANEL  CBC  DIFFERENTIAL  URINALYSIS, ROUTINE W REFLEX MICROSCOPIC   No results found.   No diagnosis found.  Now she is nauseated.  Will give zofran.  10:54 PM Charge nurse told me she is in severe pain when he went to discharge her.  She says she does NOT want more narcotics.  She looks very uncomfortable. Abdomen soft. No acute abdoman.  Will give glycopyrolate.  MDM  Hypercalcemia - not significantly elevated- no ed tx indicated Nausea Abdominal pain. No acute abdomen.          Nicholes Stairs, MD 12/16/11 2224  Nicholes Stairs, MD 12/16/11 2255

## 2011-12-16 NOTE — ED Notes (Signed)
Has multiple c/o related to her parathyroid problem; per old chart, she  has been told she needs surgery to have the parathyroid removed, and has an  appt to see surgeon on Wednesday this week. Has been feeling weak, nauseated, hist of GERD, vomits frequently, even w small frequent meals, c/o pain across left chest and left scapular area and into neck/throat; states her daughter died at age 52 related to thyroid/heart issues; D Garnette Czech notified

## 2011-12-16 NOTE — ED Notes (Signed)
Pt sent to ED from Urgent Care with multi. Complaints.  Pt c/o pain in left chest,  Generalized abd pain with nausea and vomiting x's 2 weeks st's worse today.

## 2011-12-16 NOTE — ED Provider Notes (Signed)
History     CSN: [id number]  Arrival date & time 12/16/11  1326   None     Chief Complaint  Patient presents with  . Thyroid Problem    (Consider location/radiation/quality/duration/timing/severity/associated sxs/prior treatment) HPI Comments: Pt states she has been having N/V, epigastric abdominal pain and feeling very weak for 2 weeks. She was recently diagnosed with hyperparathyroidism and referred to a surgeon. Her appt is this week. She has had intermittent Lt chest discomfort and today is having burning pain in Lt chest and shoulder area. She had diarrhea 3 days last week. Appetite is decreased, and can eat only small amounts at a time. These symptoms are all ongoing.    Past Medical History  Diagnosis Date  . Hypertrophic pyloric stenosis   . GERD (gastroesophageal reflux disease)     History reviewed. No pertinent past surgical history.  History reviewed. No pertinent family history.  History  Substance Use Topics  . Smoking status: Never Smoker   . Smokeless tobacco: Not on file  . Alcohol Use: Not on file    OB History    Grav Para Term Preterm Abortions TAB SAB Ect Mult Living                  Review of Systems  Constitutional: Positive for appetite change and fatigue. Negative for fever and chills.  HENT: Negative for ear pain, sore throat and rhinorrhea.   Respiratory: Negative for cough, shortness of breath and wheezing.   Cardiovascular: Positive for chest pain. Negative for palpitations.  Gastrointestinal: Positive for nausea, vomiting, abdominal pain and diarrhea.  Genitourinary: Negative for dysuria and frequency.  Neurological: Positive for weakness.    Allergies  Demerol; Imitrex; and Iodine  Home Medications   Current Outpatient Rx  Name Route Sig Dispense Refill  . HYDROCODONE-ACETAMINOPHEN 5-500 MG PO TABS Oral Take 1 tablet by mouth every 4 (four) hours as needed for pain. 20 tablet 0  . LORATADINE 10 MG PO TABS Oral Take 1 tablet (10  mg total) by mouth daily. 30 tablet 2  . SOLUBLE FIBER/PROBIOTICS PO Oral Take by mouth.        BP 146/91  Pulse 72  Temp(Src) 97.4 F (36.3 C) (Oral)  Resp 18  SpO2 97%  Physical Exam  Nursing note and vitals reviewed. Constitutional: She appears well-developed and well-nourished. No distress.  HENT:  Head: Normocephalic and atraumatic.  Right Ear: Tympanic membrane, external ear and ear canal normal.  Left Ear: Tympanic membrane, external ear and ear canal normal.  Nose: Nose normal.  Mouth/Throat: Uvula is midline, oropharynx is clear and moist and mucous membranes are normal. No oropharyngeal exudate, posterior oropharyngeal edema or posterior oropharyngeal erythema.  Neck: Neck supple.  Cardiovascular: Normal rate, regular rhythm and normal heart sounds.   Pulmonary/Chest: Effort normal and breath sounds normal. No respiratory distress. She exhibits tenderness (Lt superior chest wall).  Abdominal: Normal appearance and bowel sounds are normal. She exhibits no mass. There is no hepatosplenomegaly. There is tenderness in the epigastric area, left upper quadrant and left lower quadrant. There is no guarding and no CVA tenderness.  Lymphadenopathy:    She has no cervical adenopathy.  Neurological: She is alert.  Skin: Skin is warm and dry.  Psychiatric: She has a normal mood and affect.    ED Course  Procedures (including critical care time)  Labs Reviewed - No data to display No results found.   1. Chest pressure   2. Nausea, vomiting and  diarrhea   3. Abdominal pain   4. Weakness       MDM  EKG NSR. Pt transferred to [facility].       Melody Comas, [place] 12/16/11 1540

## 2011-12-16 NOTE — ED Notes (Signed)
Pt requested and was given a few ice chips per nurse.

## 2011-12-17 ENCOUNTER — Encounter: Payer: Self-pay | Admitting: Family Medicine

## 2011-12-18 ENCOUNTER — Encounter (INDEPENDENT_AMBULATORY_CARE_PROVIDER_SITE_OTHER): Payer: Self-pay | Admitting: Surgery

## 2011-12-18 ENCOUNTER — Ambulatory Visit (INDEPENDENT_AMBULATORY_CARE_PROVIDER_SITE_OTHER): Payer: Commercial Managed Care - PPO | Admitting: Surgery

## 2011-12-18 DIAGNOSIS — E213 Hyperparathyroidism, unspecified: Secondary | ICD-10-CM

## 2011-12-18 NOTE — Progress Notes (Signed)
  CC: Hyperparathyroidism HPI: We were asked to see this patient by her primary physician to evaluate for possible hyperparathyroidism. She is a generally healthy 51 year old lady has been having problems with abdominal pain, fatigue, and headaches. She is been at least once to the urgent care for her abdominal pain. As part of her evaluation she was found to have an elevated calcium of 11.1 and an elevated parathormone level of about 252. We were asked to see her to consider parathyroid surgery.  Of note is that at least two family members including her brother have had problems with parathyroid tumors. She is not clear on the exact diagnosis.   ROS: Be positive for hearing loss chills swallowing problems voice change visual problems chest pain leg swelling palpitations abdominal distention abdominal pain constipation nausea vomiting arthritis pains headaches status weakness and rash  MEDS: Current Outpatient Prescriptions  Medication Sig Dispense Refill  . Calcium-Magnesium-Vitamin D (CALCIUM MAGNESIUM PO) Take by mouth daily.      . Probiotic Product (PROBIOTIC PO) Take by mouth daily.         ALLERGIES:  Allergies  Allergen Reactions  . Demerol   . Imitrex (Sumatriptan Base)   . Iodine   . Penicillins      PE General: The patient is alert awake oriented and in no distress Neck: Neck is supple. Thyroid is normal. There are no masses. There is no adenopathy.  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 I have notified her primary care physician and they can treat her GI symptoms until we can get resolution about her  parathyroid disease.

## 2011-12-18 NOTE — Patient Instructions (Signed)
We will schedule a scan to evaluate her parathyroid gland. I have sent a note to your primary care physician about your intestinal symptoms and about her family history of parathyroid problems so they can make decisions about further evaluation.  We will call you as soon as we have a result on your scan.

## 2011-12-19 ENCOUNTER — Ambulatory Visit (HOSPITAL_COMMUNITY)
Admission: RE | Admit: 2011-12-19 | Discharge: 2011-12-19 | Disposition: A | Discharge status: Home / Self Care | Payer: Commercial Managed Care - PPO | Source: Ambulatory Visit | Attending: Family Medicine | Admitting: Family Medicine

## 2011-12-19 ENCOUNTER — Ambulatory Visit (INDEPENDENT_AMBULATORY_CARE_PROVIDER_SITE_OTHER): Payer: Self-pay | Admitting: Family Medicine

## 2011-12-19 ENCOUNTER — Encounter: Payer: Self-pay | Admitting: Family Medicine

## 2011-12-19 DIAGNOSIS — E210 Primary hyperparathyroidism: Secondary | ICD-10-CM

## 2011-12-19 DIAGNOSIS — R109 Unspecified abdominal pain: Secondary | ICD-10-CM | POA: Insufficient documentation

## 2011-12-19 DIAGNOSIS — R1110 Vomiting, unspecified: Secondary | ICD-10-CM | POA: Insufficient documentation

## 2011-12-19 MED ORDER — ONDANSETRON HCL 4 MG PO TABS: 4.0000 mg | ORAL_TABLET | Freq: Three times a day (TID) | ORAL | Status: AC | PRN

## 2011-12-19 MED ORDER — TRAMADOL HCL 50 MG PO TABS: 50.0000 mg | ORAL_TABLET | Freq: Three times a day (TID) | ORAL | Status: AC | PRN

## 2011-12-19 NOTE — Progress Notes (Signed)
  Subjective:    Patient ID: Terri Montgomery, female    DOB: December 08, 1960, 51 y.o.   MRN: [id number]  HPI 1.  Abd pain/N/V:  Patient with persistent abd pain.  Also complaining of nausea.  Several episodes of vomiting after eating since Saturday.  Went to Urgent Care due to pain on Tuesday and sent to the ER.  Was provided morphine which made her diaphoretic and more nauseous, also somewhat jittery.  Since leaving ER, has been worsening. Complaining of Right sided abdominal pain. Hungry but not able to tolerate much food.  Has been constipated x 3 days, diarrhea last week.  Very upset because of how poorly she feels.  N/V persists, 1-2 episodes a day.  No fevers or chills.  Vomiting occurs after eating. Feels she is able to tolerate by mouth liquids somewhat but unable to tolerate by mouth food  Seen by Surgery who set her up for scan which is a week from tomorrow.     Review of Systems See HPI above for review of systems.       Objective:   Physical Exam . Gen.: Patient lying on exam table in mild distress. Depressed appearing. Tearful. Neck:  No masses Cardiac:  Regular rate and rhythm without murmur auscultated.  Good S1/S2. Pulm:  Clear to auscultation bilaterally with good air movement.  No wheezes or rales noted.   Abd:  Right-sided upper and lower quadrant tenderness. Moderate in intensity. No guarding or rebound. Bowel sounds hypoactive. No pain or tenderness on left side.        Assessment & Plan:

## 2011-12-19 NOTE — Assessment & Plan Note (Signed)
Discuss this is likely secondary to hyperparathyroidism. However as she has had little bit of acute worsening in the past 3 days, vomiting, no bowel movements I want to make sure she does not have the beginnings of obstruction. She does have history of cholecystectomy. I will send for abdominal x-ray. Treat pain with tramadol. Treating vomiting with Zofran. I will see her back next week to assess for improvement. I will also call her with the x-ray results.

## 2011-12-19 NOTE — Patient Instructions (Signed)
We will get you set up for a belly xray. Take the Zofran for nausea. Take the Tramadol 1-2 tabs for pain relief. Come back and see me next week.

## 2011-12-19 NOTE — Assessment & Plan Note (Signed)
Now being followed by surgery. She has scan setup for next Friday.

## 2011-12-20 ENCOUNTER — Encounter (HOSPITAL_COMMUNITY): Payer: Self-pay | Admitting: *Deleted

## 2011-12-20 ENCOUNTER — Emergency Department (HOSPITAL_COMMUNITY)
Admission: EM | Admit: 2011-12-20 | Discharge: 2011-12-20 | Discharge status: Against Medical Advice | Payer: Commercial Managed Care - PPO | Attending: Emergency Medicine | Admitting: Emergency Medicine

## 2011-12-20 DIAGNOSIS — K5900 Constipation, unspecified: Secondary | ICD-10-CM | POA: Insufficient documentation

## 2011-12-20 NOTE — ED Notes (Signed)
Pt in c/o constipation since Monday, also pain with urination, and nausea

## 2011-12-20 NOTE — ED Notes (Signed)
Pt called to be room, pt no longer in lobby

## 2011-12-24 ENCOUNTER — Ambulatory Visit (HOSPITAL_COMMUNITY)
Admission: RE | Admit: 2011-12-24 | Discharge: 2011-12-24 | Disposition: A | Discharge status: Home / Self Care | Payer: Commercial Managed Care - PPO | Source: Ambulatory Visit | Attending: Surgery | Admitting: Surgery

## 2011-12-24 ENCOUNTER — Ambulatory Visit (HOSPITAL_COMMUNITY): Admission: RE | Admit: 2011-12-24 | Payer: Commercial Managed Care - PPO | Source: Ambulatory Visit

## 2011-12-24 ENCOUNTER — Encounter (HOSPITAL_COMMUNITY): Admission: RE | Admit: 2011-12-24 | Payer: Commercial Managed Care - PPO | Source: Ambulatory Visit

## 2011-12-24 ENCOUNTER — Inpatient Hospital Stay (HOSPITAL_COMMUNITY): Admission: RE | Admit: 2011-12-24 | Payer: Commercial Managed Care - PPO | Source: Ambulatory Visit

## 2011-12-24 ENCOUNTER — Other Ambulatory Visit (INDEPENDENT_AMBULATORY_CARE_PROVIDER_SITE_OTHER): Payer: Self-pay | Admitting: Surgery

## 2011-12-24 DIAGNOSIS — R946 Abnormal results of thyroid function studies: Secondary | ICD-10-CM | POA: Insufficient documentation

## 2011-12-24 MED ORDER — TECHNETIUM TC 99M SESTAMIBI - CARDIOLITE
25.0000 | Freq: Once | INTRAVENOUS | Status: AC | PRN
  Administered 2011-12-24: 11:00:00 25 via INTRAVENOUS

## 2011-12-24 MED ORDER — TECHNETIUM TC 99M SESTAMIBI - CARDIOLITE
25.0000 | Freq: Once | INTRAVENOUS | Status: AC | PRN
  Administered 2011-12-24: 25 via INTRAVENOUS

## 2011-12-27 ENCOUNTER — Encounter (HOSPITAL_COMMUNITY): Payer: Self-pay

## 2011-12-27 ENCOUNTER — Telehealth (INDEPENDENT_AMBULATORY_CARE_PROVIDER_SITE_OTHER): Payer: Self-pay | Admitting: General Surgery

## 2011-12-27 DIAGNOSIS — D351 Benign neoplasm of parathyroid gland: Secondary | ICD-10-CM

## 2011-12-27 NOTE — Telephone Encounter (Signed)
Patient called back, made her aware it did look like they were able to pinpoint on the scan a parathyroid gland that could be causing the problem. Made her aware I would need direction from Dr Jamey Ripa to advise on the next step. Made her aware we would call her back.

## 2011-12-27 NOTE — Telephone Encounter (Signed)
Patient called looking for results of her parathyroid scan. I called patient and left voicemail for patient to call me back.

## 2011-12-30 NOTE — Telephone Encounter (Signed)
The scan suggests there may be an abnormality within the thyroid gland. Will get thyroid ultrasound to see if abnormality in the gland can be detected

## 2012-01-01 NOTE — Telephone Encounter (Signed)
Addended byLiliana Cline on: 01/01/2012 03:47 PM   Modules accepted: Orders

## 2012-01-01 NOTE — Telephone Encounter (Signed)
Spoke with patient, made her aware we need to get an ultrasound. Order placed.

## 2012-01-02 ENCOUNTER — Ambulatory Visit
Admission: RE | Admit: 2012-01-02 | Discharge: 2012-01-02 | Disposition: A | Discharge status: Home / Self Care | Payer: Commercial Managed Care - PPO | Source: Ambulatory Visit | Attending: Surgery | Admitting: Surgery

## 2012-01-02 ENCOUNTER — Other Ambulatory Visit (INDEPENDENT_AMBULATORY_CARE_PROVIDER_SITE_OTHER): Payer: Self-pay | Admitting: Surgery

## 2012-01-02 ENCOUNTER — Telehealth (INDEPENDENT_AMBULATORY_CARE_PROVIDER_SITE_OTHER): Payer: Self-pay | Admitting: Surgery

## 2012-01-02 DIAGNOSIS — D351 Benign neoplasm of parathyroid gland: Secondary | ICD-10-CM

## 2012-01-02 NOTE — Telephone Encounter (Signed)
Her ultrasound shows what appears to be a parathyroid adenoma left superior. I talked with her and she would like to go ahead and schedule surgery. She will come to see me for a pre-op visit so we can review the details of the surgery

## 2012-01-06 ENCOUNTER — Other Ambulatory Visit: Payer: Commercial Managed Care - PPO

## 2012-01-07 ENCOUNTER — Encounter (HOSPITAL_COMMUNITY): Payer: Self-pay | Admitting: Pharmacy Technician

## 2012-01-09 ENCOUNTER — Encounter: Payer: Self-pay | Admitting: Family Medicine

## 2012-01-09 ENCOUNTER — Ambulatory Visit (INDEPENDENT_AMBULATORY_CARE_PROVIDER_SITE_OTHER): Payer: Commercial Managed Care - PPO | Admitting: Family Medicine

## 2012-01-09 VITALS — BP 125/81 | HR 60 | Temp 97.9°F | Ht 62.0 in | Wt 140.0 lb

## 2012-01-09 DIAGNOSIS — J029 Acute pharyngitis, unspecified: Secondary | ICD-10-CM

## 2012-01-09 NOTE — Progress Notes (Signed)
  Subjective:    Patient ID: Terri Montgomery, female    DOB: July 15, 1961, 51 y.o.   MRN: [id number]  HPI 2 day history of sore throat  No fever (99) but has felt cold.  Soreness when eating, also tender on left side of neck.  No rhinorrhea, cough.  Notes pain is moderate to severe.    PMH sig for left parathyroid adenoma, has surgery scheduled in mid march.  Has baseline symptoms of nausea, muscle aches, constipation due to primary hyperparathyroidism, notes no increase.  No dyspnea or inability to swallow. Review of Systems See HPI    Objective:   Physical Exam GEN: Alert & Oriented, No acute distress HEENT: [place]/AT. EOMI, PERRLA, no conjunctival injection or scleral icterus.  Bilateral tympanic membranes intact without erythema or effusion.  .  Nares without edema or rhinorrhea.  Oropharynx is without erythema or exudates. No mass effect or edema in posterior oropharynx No anterior or posterior cervical lymphadenopathy.  Thyroid nontender.  Left neck lateral to thyroid TTP, no obvious swelling or erythema. CV:  Regular Rate & Rhythm, no murmur, normal work of breathing.  No trismus or stridor. Respiratory:  Normal work of breathing, CTAB          Assessment & Plan:

## 2012-01-09 NOTE — Assessment & Plan Note (Signed)
Neg strep, will treat as viral pharyngitis with NSAIDs as most likely cause.    Given the severity of her pain and tenderness of left neck, advised for close follow-up and monitoring for symptoms for dysphagia, dyspnea, swelling, or fever to indicate possible infection/abscess or inflammation related to neck/left parathyroid adenoma.

## 2012-01-09 NOTE — Patient Instructions (Signed)
Strep test negative.  Will treat as viral pharyngitis (sore throat) with pain medications- high dose ibuprofen 800 mg three times a day  If you notice trouble breathing, trouble swallowing, or other concerns, please call

## 2012-01-14 ENCOUNTER — Encounter (HOSPITAL_COMMUNITY): Payer: Self-pay

## 2012-01-14 ENCOUNTER — Encounter (HOSPITAL_COMMUNITY)
Admission: RE | Admit: 2012-01-14 | Discharge: 2012-01-14 | Disposition: A | Discharge status: Home / Self Care | Payer: Commercial Managed Care - PPO | Source: Ambulatory Visit | Attending: Surgery | Admitting: Surgery

## 2012-01-14 HISTORY — DX: Other specified postprocedural states: Z98.890

## 2012-01-14 HISTORY — DX: Nausea with vomiting, unspecified: R11.2

## 2012-01-14 LAB — SURGICAL PCR SCREEN
MRSA, PCR: NEGATIVE
Staphylococcus aureus: NEGATIVE

## 2012-01-14 LAB — CBC
Hemoglobin: 13.4 g/dL (ref 12.0–15.0)
MCH: 30.3 pg (ref 26.0–34.0)
RBC: 4.42 MIL/uL (ref 3.87–5.11)

## 2012-01-14 NOTE — Pre-Procedure Instructions (Signed)
[address]  01/14/2012   Your procedure is scheduled on:  Tuesday, March 12  Report to [facility] at 5:30 AM.  Call this number if you have problems the morning of surgery: [phone]   Remember:   Do not eat food:After Midnight.  May have clear liquids: up to 4 Hours before arrival.  Clear liquids include soda, tea, black coffee, apple or grape juice, broth.  Take these medicines the morning of surgery with A SIP OF WATER: none   Do not wear jewelry, make-up or nail polish.  Do not wear lotions, powders, or perfumes. You may wear deodorant.  Do not shave 48 hours prior to surgery.  Do not bring valuables to the hospital.  Contacts, dentures or bridgework may not be worn into surgery.  Leave suitcase in the car. After surgery it may be brought to your room.  For patients admitted to the hospital, checkout time is 11:00 AM the day of discharge.   Patients discharged the day of surgery will not be allowed to drive home.  Name and phone number of your driver: NA  Special Instructions: CHG Shower Use Special Wash: 1/2 bottle night before surgery and 1/2 bottle morning of surgery.   Please read over the following fact sheets that you were given: Pain Booklet, Coughing and Deep Breathing and Surgical Site Infection Prevention

## 2012-01-15 ENCOUNTER — Ambulatory Visit (INDEPENDENT_AMBULATORY_CARE_PROVIDER_SITE_OTHER): Payer: Commercial Managed Care - PPO | Admitting: Surgery

## 2012-01-15 ENCOUNTER — Encounter (INDEPENDENT_AMBULATORY_CARE_PROVIDER_SITE_OTHER): Payer: Self-pay | Admitting: Surgery

## 2012-01-15 VITALS — BP 128/86 | HR 68 | Temp 97.1°F | Resp 12 | Ht 61.5 in | Wt 142.8 lb

## 2012-01-15 DIAGNOSIS — E210 Primary hyperparathyroidism: Secondary | ICD-10-CM

## 2012-01-15 NOTE — Patient Instructions (Signed)
Call me if you have any questions before surgery

## 2012-01-15 NOTE — Progress Notes (Signed)
  CC: Hyperparathyroidism HPI: We were asked to see this patient by her primary physician to evaluate for possible hyperparathyroidism. She is a generally healthy 51 year old lady has been having problems with abdominal pain, fatigue, and headaches. She is been at least once to the urgent care for her abdominal pain. As part of her evaluation she was found to have an elevated calcium of 11.1 and an elevated parathormone level of about 252. We were asked to see her to consider parathyroid surgery.  Of note is that at least two family members including her brother have had problems with parathyroid tumors. She is not clear on the exact diagnosis.  After her visit here we obtained both a parathyroid scan and a sono and the adenoma looks like it is on them left, probably a left superior gland   ROS: Be positive for hearing loss chills swallowing problems voice change visual problems chest pain leg swelling palpitations abdominal distention abdominal pain constipation nausea vomiting arthritis pains headaches status weakness and rash  MEDS: Current Outpatient Prescriptions  Medication Sig Dispense Refill  . ibuprofen (ADVIL,MOTRIN) 200 MG tablet Take 200 mg by mouth every 6 (six) hours as needed.         ALLERGIES:  Allergies  Allergen Reactions  . Demerol Shortness Of Breath  . Iodine Shortness Of Breath  . Penicillins Shortness Of Breath  . Imitrex (Sumatriptan Base) Other (See Comments)    Increases HA  . Morphine And Related Nausea And Vomiting     PE General: The patient is alert awake oriented and in no distress Neck: Neck is supple. Thyroid is normal. There are no masses. There is no adenopathy. Lungs: Normal respirations, clear to auscultation Heart: Reg no M,R,G. Abd: soft and no tender - no mass or organomegaly Data Reviewed I have reviewed with the notes from her primary care as well as the information in Epic Reviewed the scans as well  Assessment Probable  hyperparathyroidism likely secondary to parathyroid adenoma Possible familial history of hyperparathyroidism GI symptoms, fatigue, and generalized aches and pains may well be related to her hypercalcemia  Plan I have recommended parathyroid exploration for adenoma. Will try to do minimally invasive, Discussed risks and complications All questions answered.

## 2012-01-20 MED ORDER — CIPROFLOXACIN IN D5W 400 MG/200ML IV SOLN
400.0000 mg | INTRAVENOUS | Status: DC
  Filled 2012-01-20: qty 200

## 2012-01-21 ENCOUNTER — Encounter (HOSPITAL_COMMUNITY)
Admission: RE | Disposition: A | Discharge status: Home / Self Care | Payer: Self-pay | Source: Ambulatory Visit | Attending: Surgery

## 2012-01-21 ENCOUNTER — Ambulatory Visit (HOSPITAL_COMMUNITY)
Admission: RE | Admit: 2012-01-21 | Discharge: 2012-01-22 | Disposition: A | Discharge status: Home / Self Care | Payer: Commercial Managed Care - PPO | Source: Ambulatory Visit | Attending: Surgery | Admitting: Surgery

## 2012-01-21 ENCOUNTER — Ambulatory Visit (HOSPITAL_COMMUNITY): Payer: Commercial Managed Care - PPO | Admitting: Anesthesiology

## 2012-01-21 ENCOUNTER — Encounter (HOSPITAL_COMMUNITY): Payer: Self-pay | Admitting: *Deleted

## 2012-01-21 ENCOUNTER — Encounter (HOSPITAL_COMMUNITY): Payer: Self-pay | Admitting: Anesthesiology

## 2012-01-21 DIAGNOSIS — E213 Hyperparathyroidism, unspecified: Secondary | ICD-10-CM | POA: Insufficient documentation

## 2012-01-21 DIAGNOSIS — E210 Primary hyperparathyroidism: Secondary | ICD-10-CM

## 2012-01-21 DIAGNOSIS — Z01812 Encounter for preprocedural laboratory examination: Secondary | ICD-10-CM | POA: Insufficient documentation

## 2012-01-21 HISTORY — PX: PARATHYROIDECTOMY: SHX19

## 2012-01-21 SURGERY — PARATHYROIDECTOMY
Anesthesia: General | Wound class: Clean

## 2012-01-21 MED ORDER — MIDAZOLAM HCL 5 MG/5ML IJ SOLN
INTRAMUSCULAR | Status: DC | PRN
  Administered 2012-01-21: 2 mg via INTRAVENOUS

## 2012-01-21 MED ORDER — ACETAMINOPHEN 10 MG/ML IV SOLN
1000.0000 mg | Freq: Once | INTRAVENOUS | Status: AC
  Administered 2012-01-21: 1000 mg via INTRAVENOUS
  Filled 2012-01-21: qty 100

## 2012-01-21 MED ORDER — CIPROFLOXACIN IN D5W 400 MG/200ML IV SOLN
INTRAVENOUS | Status: DC | PRN
  Administered 2012-01-21: 400 mg via INTRAVENOUS

## 2012-01-21 MED ORDER — ROCURONIUM BROMIDE 100 MG/10ML IV SOLN
INTRAVENOUS | Status: DC | PRN
  Administered 2012-01-21: 40 mg via INTRAVENOUS

## 2012-01-21 MED ORDER — 0.9 % SODIUM CHLORIDE (POUR BTL) OPTIME
TOPICAL | Status: DC | PRN
  Administered 2012-01-21: 1000 mL

## 2012-01-21 MED ORDER — BUPIVACAINE HCL (PF) 0.25 % IJ SOLN
INTRAMUSCULAR | Status: DC | PRN
  Administered 2012-01-21: 6 mL

## 2012-01-21 MED ORDER — HYDROMORPHONE HCL PF 1 MG/ML IJ SOLN
0.2500 mg | INTRAMUSCULAR | Status: DC | PRN
  Administered 2012-01-21 (×5): 0.25 mg via INTRAVENOUS

## 2012-01-21 MED ORDER — ONDANSETRON HCL 4 MG PO TABS: 4.0000 mg | ORAL_TABLET | Freq: Four times a day (QID) | ORAL | Status: DC | PRN

## 2012-01-21 MED ORDER — FENTANYL CITRATE 0.05 MG/ML IJ SOLN
INTRAMUSCULAR | Status: DC | PRN
  Administered 2012-01-21: 100 ug via INTRAVENOUS
  Administered 2012-01-21: 50 ug via INTRAVENOUS

## 2012-01-21 MED ORDER — ONDANSETRON HCL 4 MG/2ML IJ SOLN: 4.0000 mg | Freq: Four times a day (QID) | INTRAMUSCULAR | Status: DC | PRN

## 2012-01-21 MED ORDER — SCOPOLAMINE 1 MG/3DAYS TD PT72
MEDICATED_PATCH | TRANSDERMAL | Status: DC | PRN
  Administered 2012-01-21: 1 via TRANSDERMAL

## 2012-01-21 MED ORDER — GLYCOPYRROLATE 0.2 MG/ML IJ SOLN
INTRAMUSCULAR | Status: DC | PRN
  Administered 2012-01-21: .8 mg via INTRAVENOUS

## 2012-01-21 MED ORDER — NEOSTIGMINE METHYLSULFATE 1 MG/ML IJ SOLN
INTRAMUSCULAR | Status: DC | PRN
  Administered 2012-01-21: 5 mg via INTRAVENOUS

## 2012-01-21 MED ORDER — LACTATED RINGERS IV SOLN
INTRAVENOUS | Status: DC | PRN
  Administered 2012-01-21 (×2): via INTRAVENOUS

## 2012-01-21 MED ORDER — DROPERIDOL 2.5 MG/ML IJ SOLN
INTRAMUSCULAR | Status: DC | PRN
  Administered 2012-01-21: 0.625 mg via INTRAVENOUS

## 2012-01-21 MED ORDER — PROPOFOL 10 MG/ML IV EMUL
INTRAVENOUS | Status: DC | PRN
  Administered 2012-01-21: 120 mg via INTRAVENOUS

## 2012-01-21 MED ORDER — PROMETHAZINE HCL 25 MG/ML IJ SOLN: 6.2500 mg | INTRAMUSCULAR | Status: DC | PRN

## 2012-01-21 MED ORDER — LIDOCAINE HCL 4 % MT SOLN
OROMUCOSAL | Status: DC | PRN
  Administered 2012-01-21: 4 mL via TOPICAL

## 2012-01-21 MED ORDER — ONDANSETRON HCL 4 MG/2ML IJ SOLN
INTRAMUSCULAR | Status: DC | PRN
  Administered 2012-01-21 (×2): 4 mg via INTRAVENOUS

## 2012-01-21 MED ORDER — DEXTROSE IN LACTATED RINGERS 5 % IV SOLN
INTRAVENOUS | Status: DC
  Administered 2012-01-21: 17:00:00 via INTRAVENOUS
  Administered 2012-01-22: 75 mL/h via INTRAVENOUS

## 2012-01-21 MED ORDER — HYDROCODONE-ACETAMINOPHEN 5-325 MG PO TABS
1.0000 | ORAL_TABLET | ORAL | Status: DC | PRN
  Administered 2012-01-21 – 2012-01-22 (×5): 2 via ORAL
  Filled 2012-01-21 (×5): qty 2

## 2012-01-21 MED ORDER — DEXAMETHASONE SODIUM PHOSPHATE 4 MG/ML IJ SOLN
INTRAMUSCULAR | Status: DC | PRN
  Administered 2012-01-21: 4 mg via INTRAVENOUS

## 2012-01-21 SURGICAL SUPPLY — 50 items
BLADE SURG 15 STRL LF DISP TIS (BLADE) ×1 IMPLANT
BLADE SURG 15 STRL SS (BLADE) ×1
BLADE SURG ROTATE 9660 (MISCELLANEOUS) IMPLANT
CANISTER SUCTION 2500CC (MISCELLANEOUS) ×2 IMPLANT
CHLORAPREP W/TINT 10.5 ML (MISCELLANEOUS) ×2 IMPLANT
CLIP TI MEDIUM 6 (CLIP) ×2 IMPLANT
CLIP TI WIDE RED SMALL 24 (CLIP) ×2 IMPLANT
CLOTH BEACON ORANGE TIMEOUT ST (SAFETY) ×2 IMPLANT
CONT SPEC 4OZ CLIKSEAL STRL BL (MISCELLANEOUS) ×2 IMPLANT
COVER SURGICAL LIGHT HANDLE (MISCELLANEOUS) ×2 IMPLANT
CRADLE DONUT ADULT HEAD (MISCELLANEOUS) ×2 IMPLANT
DECANTER SPIKE VIAL GLASS SM (MISCELLANEOUS) ×2 IMPLANT
DERMABOND ADVANCED (GAUZE/BANDAGES/DRESSINGS) ×1
DERMABOND ADVANCED .7 DNX12 (GAUZE/BANDAGES/DRESSINGS) ×1 IMPLANT
DRAPE PED LAPAROTOMY (DRAPES) ×2 IMPLANT
DRAPE UTILITY 15X26 W/TAPE STR (DRAPE) ×4 IMPLANT
ELECT CAUTERY BLADE 6.4 (BLADE) ×2 IMPLANT
ELECT REM PT RETURN 9FT ADLT (ELECTROSURGICAL) ×2
ELECTRODE REM PT RTRN 9FT ADLT (ELECTROSURGICAL) ×1 IMPLANT
GAUZE SPONGE 2X2 8PLY STRL LF (GAUZE/BANDAGES/DRESSINGS) IMPLANT
GAUZE SPONGE 4X4 16PLY XRAY LF (GAUZE/BANDAGES/DRESSINGS) ×2 IMPLANT
GLOVE BIOGEL PI IND STRL 6.5 (GLOVE) ×1 IMPLANT
GLOVE BIOGEL PI INDICATOR 6.5 (GLOVE) ×1
GLOVE EUDERMIC 7 POWDERFREE (GLOVE) ×2 IMPLANT
GLOVE SURG ORTHO 8.0 STRL STRW (GLOVE) ×2 IMPLANT
GLOVE SURG SS PI 6.5 STRL IVOR (GLOVE) ×2 IMPLANT
GLOVE SURG SS PI 7.0 STRL IVOR (GLOVE) ×2 IMPLANT
GOWN PREVENTION PLUS XLARGE (GOWN DISPOSABLE) IMPLANT
GOWN STRL NON-REIN LRG LVL3 (GOWN DISPOSABLE) ×4 IMPLANT
GOWN STRL REIN XL XLG (GOWN DISPOSABLE) ×2 IMPLANT
HEMOSTAT SURGICEL 2X4 FIBR (HEMOSTASIS) ×2 IMPLANT
KIT BASIN OR (CUSTOM PROCEDURE TRAY) ×2 IMPLANT
KIT ROOM TURNOVER OR (KITS) ×2 IMPLANT
NEEDLE HYPO 25GX1X1/2 BEV (NEEDLE) ×2 IMPLANT
NS IRRIG 1000ML POUR BTL (IV SOLUTION) ×2 IMPLANT
PACK SURGICAL SETUP 50X90 (CUSTOM PROCEDURE TRAY) ×2 IMPLANT
PAD ARMBOARD 7.5X6 YLW CONV (MISCELLANEOUS) ×4 IMPLANT
PENCIL BUTTON HOLSTER BLD 10FT (ELECTRODE) ×2 IMPLANT
SPONGE GAUZE 2X2 STER 10/PKG (GAUZE/BANDAGES/DRESSINGS)
SPONGE INTESTINAL PEANUT (DISPOSABLE) ×2 IMPLANT
STRIP CLOSURE SKIN 1/2X4 (GAUZE/BANDAGES/DRESSINGS) IMPLANT
SUT MNCRL AB 4-0 PS2 18 (SUTURE) ×2 IMPLANT
SUT SILK 3 0 (SUTURE) ×1
SUT SILK 3-0 18XBRD TIE 12 (SUTURE) ×1 IMPLANT
SUT VIC AB 3-0 SH 18 (SUTURE) ×2 IMPLANT
SYR BULB 3OZ (MISCELLANEOUS) ×2 IMPLANT
SYR CONTROL 10ML LL (SYRINGE) ×2 IMPLANT
TOWEL OR 17X24 6PK STRL BLUE (TOWEL DISPOSABLE) ×2 IMPLANT
TOWEL OR 17X26 10 PK STRL BLUE (TOWEL DISPOSABLE) ×2 IMPLANT
TUBE CONNECTING 12X1/4 (SUCTIONS) ×2 IMPLANT

## 2012-01-21 NOTE — Anesthesia Postprocedure Evaluation (Signed)
  Anesthesia Post-op Note  Patient: Terri Montgomery  Procedure(s) Performed: Procedure(s) (LRB): PARATHYROIDECTOMY (N/A)  Patient Location: PACU  Anesthesia Type: General  Level of Consciousness: awake  Airway and Oxygen Therapy: Patient Spontanous Breathing  Post-op Pain: mild  Post-op Assessment: Post-op Vital signs reviewed  Post-op Vital Signs: stable  Complications: No apparent anesthesia complications

## 2012-01-21 NOTE — Preoperative (Signed)
Beta Blockers   Reason not to administer Beta Blockers:Not Applicable 

## 2012-01-21 NOTE — OR Nursing (Signed)
Specimen received in pathology at 0807. Called received from [facility].

## 2012-01-21 NOTE — Progress Notes (Signed)
Patient's pain not relieved with Norco 2 tabs p.o. Dr. Janee Morn notified. Order for one dose of Tylenol I.V.

## 2012-01-21 NOTE — H&P (View-Only) (Signed)
  CC: Hyperparathyroidism HPI: We were asked to see this patient by her primary physician to evaluate for possible hyperparathyroidism. She is a generally healthy 50-year-old lady has been having problems with abdominal pain, fatigue, and headaches. She is been at least once to the urgent care for her abdominal pain. As part of her evaluation she was found to have an elevated calcium of 11.1 and an elevated parathormone level of about 252. We were asked to see her to consider parathyroid surgery.  Of note is that at least two family members including her brother have had problems with parathyroid tumors. She is not clear on the exact diagnosis.  After her visit here we obtained both a parathyroid scan and a sono and the adenoma looks like it is on them left, probably a left superior gland   ROS: Be positive for hearing loss chills swallowing problems voice change visual problems chest pain leg swelling palpitations abdominal distention abdominal pain constipation nausea vomiting arthritis pains headaches status weakness and rash  MEDS: Current Outpatient Prescriptions  Medication Sig Dispense Refill  . ibuprofen (ADVIL,MOTRIN) 200 MG tablet Take 200 mg by mouth every 6 (six) hours as needed.         ALLERGIES:  Allergies  Allergen Reactions  . Demerol Shortness Of Breath  . Iodine Shortness Of Breath  . Penicillins Shortness Of Breath  . Imitrex (Sumatriptan Base) Other (See Comments)    Increases HA  . Morphine And Related Nausea And Vomiting     PE General: The patient is alert awake oriented and in no distress Neck: Neck is supple. Thyroid is normal. There are no masses. There is no adenopathy. Lungs: Normal respirations, clear to auscultation Heart: Reg no M,R,G. Abd: soft and no tender - no mass or organomegaly Data Reviewed I have reviewed with the notes from her primary care as well as the information in Epic Reviewed the scans as well  Assessment Probable  hyperparathyroidism likely secondary to parathyroid adenoma Possible familial history of hyperparathyroidism GI symptoms, fatigue, and generalized aches and pains may well be related to her hypercalcemia  Plan I have recommended parathyroid exploration for adenoma. Will try to do minimally invasive, Discussed risks and complications All questions answered. 

## 2012-01-21 NOTE — Anesthesia Preprocedure Evaluation (Addendum)
Anesthesia Evaluation  Patient identified by MRN, date of birth, ID band Patient awake    Reviewed: Allergy & Precautions, H&P , NPO status , Patient's Chart, lab work & pertinent test results  History of Anesthesia Complications (+) PONV  Airway Mallampati: I  Neck ROM: Full    Dental  (+) Teeth Intact   Pulmonary  breath sounds clear to auscultation        Cardiovascular Rhythm:Regular Rate:Normal     Neuro/Psych    GI/Hepatic   Endo/Other    Renal/GU      Musculoskeletal   Abdominal   Peds  Hematology   Anesthesia Other Findings   Reproductive/Obstetrics                          Anesthesia Physical Anesthesia Plan  ASA: II  Anesthesia Plan: General   Post-op Pain Management:    Induction: Intravenous  Airway Management Planned: Oral ETT  Additional Equipment:   Intra-op Plan:   Post-operative Plan: Extubation in OR  Informed Consent: I have reviewed the patients History and Physical, chart, labs and discussed the procedure including the risks, benefits and alternatives for the proposed anesthesia with the patient or authorized representative who has indicated his/her understanding and acceptance.   Dental advisory given  Plan Discussed with: CRNA, Surgeon and Anesthesiologist  Anesthesia Plan Comments:        Anesthesia Quick Evaluation

## 2012-01-21 NOTE — Interval H&P Note (Signed)
History and Physical Interval Note:  01/21/2012 7:18 AM  Terri Montgomery  has presented today for surgery, with the diagnosis of parathyroid adenoma  The various methods of treatment have been discussed with the patient and family. After consideration of risks, benefits and other options for treatment, the patient has consented to  Procedure(s) (LRB): PARATHYROIDECTOMY (N/A) as a surgical intervention .  The patients' history has been reviewed, patient examined, no change in status, stable for surgery.  I have reviewed the patients' chart and labs.  Questions were answered to the patient's satisfaction.     Ashely Joshua J

## 2012-01-21 NOTE — Op Note (Signed)
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 Darnell Level Anesthesia: General   Clinical History and Indications: This patient has presented with signs and symptoms consistent with hyperparathyroidism. A scan has shown what appears to be a gland in the left superior thyroid pole vicinity consistent with a parathyroid adenoma. Parathyroidectomy was recommended.    Description of Procedure: I saw patient preoperative area and confirmed the plans for the procedure as noted above. She had no further questions. She was taken to the operating room and satisfactory general endotracheal anesthesia achieved. The neck was prepped and draped. A timeout was done.  A short transverse incision was made extending from the midline to the left. The subcutaneous tissues and platysma were divided and a subplatysmal flap raised. The midline was identified and opened and the strap muscles elevated off of the thyroid gland. With minimal dissection I was able to identify what appeared to be a superior parathyroid gland and this was surgically excised using clips on vessels. It was removed intact. It was sent for frozen section which was consistent with hyperplastic parathyroid tissue.  I made sure everything was dry I put Fibrillar cover the area. There was a 1 cm well-circumscribed benign feeling inferior thyroid pole nodule consistent with those seen on the ultrasound. I thought this was a hyperplastic nodule as she has other small nodules noted on ultrasound.  Incision was closed in layers with 3-0 Vicryl followed by 4-0 Monocryl subcuticular and Dermabond. The patient tolerated the procedure well. There were no operative complications. Counts were  correct.  Currie Paris, MD, FACS 01/21/2012 8:41 AM

## 2012-01-21 NOTE — Anesthesia Procedure Notes (Signed)
Procedure Name: Intubation Date/Time: 01/21/2012 7:33 AM Performed by: Glendora Score A Pre-anesthesia Checklist: Patient identified, Emergency Drugs available, Suction available and Patient being monitored Patient Re-evaluated:Patient Re-evaluated prior to inductionOxygen Delivery Method: Circle system utilized Preoxygenation: Pre-oxygenation with 100% oxygen Intubation Type: IV induction Ventilation: Mask ventilation without difficulty and Oral airway inserted - appropriate to patient size Laryngoscope Size: Hyacinth Meeker and 2 Grade View: Grade II Tube type: Oral Tube size: 7.5 mm Number of attempts: 1 Airway Equipment and Method: Stylet and LTA kit utilized Placement Confirmation: ETT inserted through vocal cords under direct vision,  positive ETCO2 and breath sounds checked- equal and bilateral Secured at: 22 cm Tube secured with: Tape Dental Injury: Teeth and Oropharynx as per pre-operative assessment

## 2012-01-21 NOTE — Transfer of Care (Signed)
Immediate Anesthesia Transfer of Care Note  Patient: Terri Montgomery  Procedure(s) Performed: Procedure(s) (LRB): PARATHYROIDECTOMY (N/A)  Patient Location: PACU  Anesthesia Type: General  Level of Consciousness: awake, alert , oriented and patient cooperative  Airway & Oxygen Therapy: Patient Spontanous Breathing and Patient connected to face mask oxygen  Post-op Assessment: Report given to PACU RN  Post vital signs: Reviewed and stable  Complications: No apparent anesthesia complications

## 2012-01-22 ENCOUNTER — Telehealth (INDEPENDENT_AMBULATORY_CARE_PROVIDER_SITE_OTHER): Payer: Self-pay | Admitting: General Surgery

## 2012-01-22 LAB — CALCIUM: Calcium: 9.9 mg/dL (ref 8.4–10.5)

## 2012-01-22 MED ORDER — HYDROCODONE-ACETAMINOPHEN 5-325 MG PO TABS: 1.0000 | ORAL_TABLET | ORAL | Status: AC | PRN

## 2012-01-22 NOTE — Progress Notes (Signed)
1 Day Post-Op  Subjective: Mild incisional pain this am. Otherwise no c/o  Objective: Vital signs in last 24 hours: Temp:  [97 F (36.1 C)-98 F (36.7 C)] 98 F (36.7 C) (03/13 0553) Pulse Rate:  [50-65] 63  (03/13 0553) Resp:  [16-18] 18  (03/13 0553) BP: (95-118)/(55-70) 95/55 mmHg (03/13 0553) SpO2:  [97 %-100 %] 97 % (03/13 0553) Weight:  [140 lb (63.504 kg)] 140 lb (63.504 kg) (03/12 1611)   Intake/Output from previous day: 03/12 0701 - 03/13 0700 In: 3500 [P.O.:720; I.V.:2780] Out: 3000 [Urine:3000] Intake/Output this shift:     General appearance: alert and no distress Resp: clear to auscultation bilaterally  Incision: healing well  Lab Results:  No results found for this basename: WBC:2,HGB:2,HCT:2,PLT:2 in the last 72 hours BMET  Basename 01/21/12 1551  NA --  K --  CL --  CO2 --  GLUCOSE --  BUN --  CREATININE --  CALCIUM 10.3   PT/INR No results found for this basename: LABPROT:2,INR:2 in the last 72 hours ABG No results found for this basename: PHART:2,PCO2:2,PO2:2,HCO3:2 in the last 72 hours  MEDS, Scheduled    . acetaminophen  1,000 mg Intravenous Once  . DISCONTD: ciprofloxacin  400 mg Intravenous 120 min pre-op    Studies/Results: No results found.  Assessment: s/p Procedure(s): PARATHYROIDECTOMY Doing well and able to go home. AM Ca++ still pending, 10.3 last night  Plan: Discharge   LOS: 1 day     Currie Paris, MD, [facility], [place] [phone]   01/22/2012 7:41 AM

## 2012-01-22 NOTE — Progress Notes (Signed)
UR of chart complete.  

## 2012-01-22 NOTE — Progress Notes (Signed)
Discharge home. Home discharge instruction given, no question verbalized. Alert and oriented, not in any distress, ambulatory.

## 2012-01-22 NOTE — Telephone Encounter (Signed)
Patient made aware of path results. Will follow up at appt and call with any questions prior.  

## 2012-01-22 NOTE — Telephone Encounter (Signed)
Message copied by Liliana Cline on Wed Jan 22, 2012  4:32 PM ------      Message from: Currie Paris      Created: Wed Jan 22, 2012  3:16 PM       Tell her path is benign and as expected

## 2012-01-23 ENCOUNTER — Telehealth (INDEPENDENT_AMBULATORY_CARE_PROVIDER_SITE_OTHER): Payer: Self-pay | Admitting: General Surgery

## 2012-01-23 NOTE — Telephone Encounter (Signed)
Patient called complaining of general weakness and pain. She states she feels weak to the point of almost passing out this am. Says she has had some labored breathing, and nausea. No vomiting. She is eating and drinking okay. No fevers. She is post parathyroidectomy on 01/21/12. States her symptoms just started this am. Incision looks okay. Please advise.

## 2012-01-23 NOTE — Telephone Encounter (Signed)
Spoke with Dr Johna Sheriff who advised patient to go to ER if breathing is very labored. To check calcium level to check on weakness. Patient had calcium level checked yesterday and it was 9.9. She was made aware that that is good. She will keep an eye on everything. Her breathing does not sound labored on the phone but patient was instructed to go to the ER if needed. To follow up at office visit and call if needed.

## 2012-01-27 ENCOUNTER — Encounter (HOSPITAL_COMMUNITY): Payer: Self-pay | Admitting: Surgery

## 2012-02-10 ENCOUNTER — Encounter (INDEPENDENT_AMBULATORY_CARE_PROVIDER_SITE_OTHER): Payer: Self-pay | Admitting: Surgery

## 2012-02-10 ENCOUNTER — Ambulatory Visit (INDEPENDENT_AMBULATORY_CARE_PROVIDER_SITE_OTHER): Payer: Commercial Managed Care - PPO | Admitting: Surgery

## 2012-02-10 VITALS — BP 117/64 | HR 80 | Temp 98.6°F | Resp 18 | Ht 62.0 in | Wt 145.2 lb

## 2012-02-10 DIAGNOSIS — E210 Primary hyperparathyroidism: Secondary | ICD-10-CM

## 2012-02-10 NOTE — Patient Instructions (Signed)
We will ask the genetic counselor at the cancer center to see you because of your FH of parathyroid disease

## 2012-02-10 NOTE — Progress Notes (Signed)
NAME: Terri Montgomery                                            DOB: 03-20-1961 DATE: 02/10/2012                                                  MRN: [id number]  CC: Post op   HPI: This patient comes in for post op follow-up.Sheunderwent Removal of the left superior parathyroid gland on 01/21/2012. She feels that she is doing well.Pre operative symptoms are improved  PE: General: The patient appears to be healthy, NAD Incision healing nicely  DATA REVIEWED: Path showed hyperplastic gland. Discussed with patient  IMPRESSION: The patient is doing well S/P parathyroidectomy.    PLAN: RTC 6 weeks. Will make genetic counselor referral since she has a brother with parathyroid disease

## 2012-03-30 ENCOUNTER — Inpatient Hospital Stay (HOSPITAL_COMMUNITY)
Admission: AD | Admit: 2012-03-30 | Discharge: 2012-04-03 | DRG: 093 | Disposition: A | Discharge status: Home / Self Care | Payer: Commercial Managed Care - PPO | Source: Ambulatory Visit | Attending: Family Medicine | Admitting: Family Medicine

## 2012-03-30 ENCOUNTER — Ambulatory Visit (HOSPITAL_COMMUNITY)
Admission: RE | Admit: 2012-03-30 | Discharge: 2012-03-30 | Disposition: A | Discharge status: Home / Self Care | Payer: Commercial Managed Care - PPO | Source: Ambulatory Visit | Attending: Family Medicine | Admitting: Family Medicine

## 2012-03-30 ENCOUNTER — Inpatient Hospital Stay (HOSPITAL_COMMUNITY): Payer: Commercial Managed Care - PPO

## 2012-03-30 ENCOUNTER — Ambulatory Visit (INDEPENDENT_AMBULATORY_CARE_PROVIDER_SITE_OTHER): Payer: Commercial Managed Care - PPO | Admitting: Family Medicine

## 2012-03-30 VITALS — BP 144/77 | HR 58 | Temp 98.2°F | Ht 62.0 in | Wt 145.2 lb

## 2012-03-30 DIAGNOSIS — G8320 Monoplegia of upper limb affecting unspecified side: Principal | ICD-10-CM | POA: Diagnosis present

## 2012-03-30 DIAGNOSIS — M542 Cervicalgia: Secondary | ICD-10-CM | POA: Diagnosis present

## 2012-03-30 DIAGNOSIS — K219 Gastro-esophageal reflux disease without esophagitis: Secondary | ICD-10-CM

## 2012-03-30 DIAGNOSIS — R51 Headache: Secondary | ICD-10-CM

## 2012-03-30 DIAGNOSIS — R079 Chest pain, unspecified: Secondary | ICD-10-CM | POA: Insufficient documentation

## 2012-03-30 DIAGNOSIS — Z6826 Body mass index (BMI) 26.0-26.9, adult: Secondary | ICD-10-CM

## 2012-03-30 DIAGNOSIS — R471 Dysarthria and anarthria: Secondary | ICD-10-CM

## 2012-03-30 DIAGNOSIS — M129 Arthropathy, unspecified: Secondary | ICD-10-CM | POA: Diagnosis present

## 2012-03-30 DIAGNOSIS — Z888 Allergy status to other drugs, medicaments and biological substances status: Secondary | ICD-10-CM

## 2012-03-30 DIAGNOSIS — G43909 Migraine, unspecified, not intractable, without status migrainosus: Secondary | ICD-10-CM | POA: Diagnosis present

## 2012-03-30 DIAGNOSIS — H538 Other visual disturbances: Secondary | ICD-10-CM

## 2012-03-30 DIAGNOSIS — R29898 Other symptoms and signs involving the musculoskeletal system: Secondary | ICD-10-CM | POA: Diagnosis present

## 2012-03-30 DIAGNOSIS — Z79899 Other long term (current) drug therapy: Secondary | ICD-10-CM

## 2012-03-30 DIAGNOSIS — R29810 Facial weakness: Secondary | ICD-10-CM

## 2012-03-30 DIAGNOSIS — R519 Headache, unspecified: Secondary | ICD-10-CM | POA: Insufficient documentation

## 2012-03-30 DIAGNOSIS — E210 Primary hyperparathyroidism: Secondary | ICD-10-CM | POA: Diagnosis present

## 2012-03-30 DIAGNOSIS — E669 Obesity, unspecified: Secondary | ICD-10-CM | POA: Diagnosis present

## 2012-03-30 DIAGNOSIS — D638 Anemia in other chronic diseases classified elsewhere: Secondary | ICD-10-CM

## 2012-03-30 LAB — BASIC METABOLIC PANEL
BUN: 10 mg/dL (ref 6–23)
Calcium: 10.3 mg/dL (ref 8.4–10.5)
Creatinine, Ser: 0.66 mg/dL (ref 0.50–1.10)
GFR calc Af Amer: >90 mL/min (ref >90)
GFR calc non Af Amer: >90 mL/min (ref >90)
Glucose, Bld: 95 mg/dL (ref 70–99)

## 2012-03-30 LAB — DIFFERENTIAL
Basophils Absolute: 0 10*3/uL (ref 0.0–0.1)
Basophils Relative: 0 % (ref 0–1)
Eosinophils Absolute: 0.1 10*3/uL (ref 0.0–0.7)
Monocytes Relative: 5 % (ref 3–12)
Neutro Abs: 4 10*3/uL (ref 1.7–7.7)
Neutrophils Relative %: 50 % (ref 43–77)

## 2012-03-30 LAB — CARDIAC PANEL(CRET KIN+CKTOT+MB+TROPI)
CK, MB: 1.8 ng/mL (ref 0.3–4.0)
CK, MB: 1.6 ng/mL (ref 0.3–4.0)
Relative Index: INVALID (ref 0.0–2.5)
Total CK: 70 U/L (ref 7–177)
Troponin I: <0.3 ng/mL (ref <0.30)
Troponin I: <0.3 ng/mL (ref <0.30)

## 2012-03-30 LAB — CBC
MCH: 29.9 pg (ref 26.0–34.0)
MCHC: 34.7 g/dL (ref 30.0–36.0)
RDW: 12.9 % (ref 11.5–15.5)

## 2012-03-30 LAB — CALCIUM: Calcium: 10 mg/dL (ref 8.4–10.5)

## 2012-03-30 MED ORDER — SENNOSIDES-DOCUSATE SODIUM 8.6-50 MG PO TABS: 1.0000 | ORAL_TABLET | Freq: Every evening | ORAL | Status: DC | PRN

## 2012-03-30 MED ORDER — ONDANSETRON HCL 4 MG PO TABS
4.0000 mg | ORAL_TABLET | Freq: Three times a day (TID) | ORAL | Status: DC | PRN
  Administered 2012-04-02: 4 mg via ORAL
  Filled 2012-03-30: qty 1

## 2012-03-30 MED ORDER — SODIUM CHLORIDE 0.9 % IJ SOLN
3.0000 mL | Freq: Two times a day (BID) | INTRAMUSCULAR | Status: DC
  Administered 2012-03-30 – 2012-04-01 (×5): 3 mL via INTRAVENOUS
  Administered 2012-04-02: 10:00:00 via INTRAVENOUS
  Administered 2012-04-02 – 2012-04-03 (×2): 3 mL via INTRAVENOUS

## 2012-03-30 MED ORDER — ACETAMINOPHEN 325 MG PO TABS
650.0000 mg | ORAL_TABLET | ORAL | Status: DC | PRN
  Administered 2012-03-30 – 2012-04-01 (×5): 650 mg via ORAL
  Filled 2012-03-30 (×6): qty 2

## 2012-03-30 MED ORDER — SODIUM CHLORIDE 0.9 % IJ SOLN: 3.0000 mL | INTRAMUSCULAR | Status: DC | PRN

## 2012-03-30 MED ORDER — ONDANSETRON HCL 4 MG/2ML IJ SOLN: 4.0000 mg | Freq: Four times a day (QID) | INTRAMUSCULAR | Status: DC | PRN

## 2012-03-30 MED ORDER — SODIUM CHLORIDE 0.9 % IJ SOLN: 3.0000 mL | Freq: Two times a day (BID) | INTRAMUSCULAR | Status: DC

## 2012-03-30 MED ORDER — SODIUM CHLORIDE 0.9 % IV SOLN: 250.0000 mL | INTRAVENOUS | Status: DC | PRN

## 2012-03-30 MED ORDER — CYCLOBENZAPRINE HCL 10 MG PO TABS
5.0000 mg | ORAL_TABLET | Freq: Two times a day (BID) | ORAL | Status: DC | PRN
  Administered 2012-03-30 – 2012-03-31 (×2): 5 mg via ORAL
  Administered 2012-04-01: 11:00:00 via ORAL
  Administered 2012-04-01 – 2012-04-03 (×4): 5 mg via ORAL
  Filled 2012-03-30 (×7): qty 1

## 2012-03-30 MED ORDER — ACETAMINOPHEN 325 MG PO TABS: 650.0000 mg | ORAL_TABLET | ORAL | Status: DC | PRN

## 2012-03-30 MED ORDER — ACETAMINOPHEN 650 MG RE SUPP: 650.0000 mg | RECTAL | Status: DC | PRN

## 2012-03-30 MED ORDER — STROKE: EARLY STAGES OF RECOVERY BOOK
Freq: Once | Status: AC
  Administered 2012-03-30
  Filled 2012-03-30: qty 1

## 2012-03-30 MED ORDER — ONDANSETRON HCL 4 MG PO TABS: 4.0000 mg | ORAL_TABLET | Freq: Once | ORAL | Status: DC

## 2012-03-30 NOTE — Assessment & Plan Note (Signed)
Associated with left arm weakness. Started sat.  CT head.

## 2012-03-30 NOTE — Progress Notes (Signed)
Family Medicine Teaching Service Brief Progress Note: [id number] Went to see patient on arrival to the floor. She reports persistent left sided chest pressure like pain, not associated with shortness of breath, which has been present since Saturday. She also has been having left arm weakness, where she can't lift her arm up or move her hands and fingers. She denies any loss of sensation in the arm and hand. She feels like this has gotten worst since it started on Saturday as well. She denies any headache or any change in vision at this time.  On exam, she is unable to move her left upper extremity, including her fingers and hand. Brachioradialis reflexes are difficult to illicit bilaterally. Patient is tearful when talking but has normal speech.  A/P: will follow up on stat head CT to rule out acute bleed - follow up TSH, A1C, lipid panel, cardiac enzymes in the setting of chest pain. Repeat EKG in am.  - follow up BMP and CBC   Marena Chancy, PGY-1

## 2012-03-30 NOTE — Assessment & Plan Note (Signed)
Started last Saturday associated with headache and recent parathyroid surgery in March.  Possibly stroke. Possible conversion disorder.  Will obtain CT head and admit to inpatient team.

## 2012-03-30 NOTE — Assessment & Plan Note (Signed)
Possible SAH. Associated with new neurological deficits.  CT head and admission.

## 2012-03-30 NOTE — Progress Notes (Signed)
Terri Montgomery is an 51 y.o. female.  Chief Complaint: left arm pain  HPI:  Patient is a 51 y/o hispanic female c/o left arm pain and weakness, as well as left mouth facial droop, blurred vision, headache, neck pain, fatigue, stress and sadness.  She had a parathyroidectomy done on 01/21/12. Her last calcium level was 3/28 and was 9.9. Patient awoke Saturday morning with inability to move her left arm associated with above symptoms. She has no history of CVA or heart disease. She only takes ibuprofen PRN.  The patient is a non-smoker. No alleviating or aggravating factors. The course is worsening.  Past Medical History   Diagnosis  Date   .  Hypertrophic pyloric stenosis    .  GERD (gastroesophageal reflux disease)    .  Anemia    .  Arthritis    .  Heart murmur    .  PONV (postoperative nausea and vomiting)      HA post anesthesia   .  Primary hyperparathyroidism  12/19/2011     Underwent removal of a hyperplastic left superior parathyroid March 2013, with improvement in symptoms   .  Mastodynia, female  10/23/2011     Bilateral; worse on L side (Left outer inferior quadrant)    Past Surgical History   Procedure  Date   .  Abdominal hysterectomy  2002   .  Cholecystectomy  1998   .  Parathyroidectomy  01/21/2012     Procedure: PARATHYROIDECTOMY; Surgeon: Christian J Streck, MD; Location: [facility] OR; Service: General; Laterality: N/A; Left superior parathyroidectomy    Family History   Problem  Relation  Age of Onset   .  Heart disease  Daughter    .  Heart disease  Maternal Grandmother    .  Heart disease  Paternal Grandmother    .  Anesthesia problems  Neg Hx    Social History: reports that she has never smoked. She does not have any smokeless tobacco history on file. She reports that she does not drink alcohol or use illicit drugs.  Allergies:  Allergies   Allergen  Reactions   .  Demerol  Shortness Of Breath   .  Iodine  Shortness Of Breath   .  Penicillins  Shortness Of Breath   .   Imitrex (Sumatriptan Base)  Other (See Comments)     Increases HA   .  Morphine And Related  Nausea And Vomiting   ROS  No fevers, chills or night sweats.  No GI/GU symptoms.  No MSK symptoms other than left arm weakness.  No syncope. Reports stress and current depression.  Blood pressure 144/77, pulse 58, temperature 98.2 F (36.8 C), temperature source Oral, height 5' 2" (1.575 m), weight 145 lb 4 oz (65.885 kg).  Physical Exam  Lungs: Normal respiratory effort, chest expands symmetrically. Lungs are clear to auscultation, no crackles or wheezes.  Heart - Regular rate and rhythm. No murmurs, gallops or rubs.  Pulses: equal in left and right arm.  Eye - Pupils Equal Round Reactive to light, Extraocular movements intact, Fundi without hemorrhage or visible lesions, Conjunctiva without redness or discharge  Nose: External nasal examination shows no deformity or inflammation. Nasal mucosa are pink and moist without lesions or exudates. No septal dislocation or dislocation.No obstruction to airflow.  Mouth - no lesions, mucous membranes are moist, no decaying teeth. On smile patient has left facial droop.  Neck: No deformities, thyromegaly, masses, or tenderness noted. Supple with full range of   motion without pain.  Neurologic exam :  Cn 2-7 intact CN 12 abnormal with poor shoulder shrug on the left side.  Strength not equal in upper ext. Left arm is 0/5 strength. She can move her fingers, but has no grasp/finger pincer.  She has left lower facial droop of the mouth.  Able to walk on heels and toes.  Balance normal  Romberg normal, finger to nose  Reflexes normal in patellar, biceps, triceps.  Skin: Intact without suspicious lesions or rashes  Psych: Cognition and judgment appear intact. Alert, communicative and cooperative with normal attention span and concentration. No apparent delusions, illusions, hallucinations. She is sad and tearful.   Assessment/Plan  51 y/o hispanic female with  left facial droop and left arm paresis with possible CVA also complaining of headache, and chest pain. EKG normal.  1. Left Arm weakness/facial droop.  Right CVA/Subarachnoid hemorrhage likely. Will obtain CT head. >48 hours. Not hypertensive.  Possible conversion disorder Secondary to stress - unknown reason, but patient states she is very stressed out.  2. Chest pain  EKG normal here in office. Typical pain with left arm and neck pain as well as central chest pressure.  Will admit to telemetry and cycle enzymes.  3. Headache  CT head  Possible subarachnoid hemorrhage.  Had "hard acute head pain with neck pain"  4. Recent parathyroid surgery  Lab Results   Component  Value  Date    CALCIUM  9.9  01/22/2012   Will order a BMP.  5. Dispo: inpatient for Stroke workup, chest pain rule out.  Discussed with Inpatient team.   Cesilia Shinn  03/30/2012, 3:40 PM  I interviewed and examined Mrs Ainsley and discussed her evaluation with Dr Johntay Doolen. Wayne A. Hale, MD.  

## 2012-03-30 NOTE — Assessment & Plan Note (Signed)
Described as pressure. Will obtain EKG. Patient will admitted for stroke workup.

## 2012-03-30 NOTE — Patient Instructions (Signed)
Your symptoms are worrisome for stroke.  We will send you to the hospital and obtain a CT scan of your head.  You will be cared for by the hospital family medicine team.

## 2012-03-31 ENCOUNTER — Inpatient Hospital Stay (HOSPITAL_COMMUNITY): Payer: Commercial Managed Care - PPO

## 2012-03-31 ENCOUNTER — Encounter (INDEPENDENT_AMBULATORY_CARE_PROVIDER_SITE_OTHER): Payer: Commercial Managed Care - PPO | Admitting: Surgery

## 2012-03-31 ENCOUNTER — Other Ambulatory Visit: Payer: Self-pay

## 2012-03-31 DIAGNOSIS — M542 Cervicalgia: Secondary | ICD-10-CM | POA: Diagnosis present

## 2012-03-31 LAB — BASIC METABOLIC PANEL
CO2: 26 mEq/L (ref 19–32)
Calcium: 10.1 mg/dL (ref 8.4–10.5)
Creatinine, Ser: 0.7 mg/dL (ref 0.50–1.10)

## 2012-03-31 LAB — CBC
MCH: 29.8 pg (ref 26.0–34.0)
MCHC: 34 g/dL (ref 30.0–36.0)
MCV: 87.5 fL (ref 78.0–100.0)
Platelets: 335 10*3/uL (ref 150–400)
RDW: 12.9 % (ref 11.5–15.5)
WBC: 8.5 10*3/uL (ref 4.0–10.5)

## 2012-03-31 LAB — MAGNESIUM: Magnesium: 2.1 mg/dL (ref 1.5–2.5)

## 2012-03-31 LAB — PHOSPHORUS: Phosphorus: 3.6 mg/dL (ref 2.3–4.6)

## 2012-03-31 LAB — TSH: TSH: 1.623 u[IU]/mL (ref 0.350–4.500)

## 2012-03-31 LAB — HEMOGLOBIN A1C: Hgb A1c MFr Bld: 6 % — ABNORMAL HIGH (ref <5.7)

## 2012-03-31 LAB — CARDIAC PANEL(CRET KIN+CKTOT+MB+TROPI): Relative Index: INVALID (ref 0.0–2.5)

## 2012-03-31 MED ORDER — GADOBENATE DIMEGLUMINE 529 MG/ML IV SOLN
17.0000 mL | Freq: Once | INTRAVENOUS | Status: AC | PRN
  Administered 2012-03-31: 17 mL via INTRAVENOUS

## 2012-03-31 MED ORDER — TRAMADOL HCL 50 MG PO TABS
50.0000 mg | ORAL_TABLET | Freq: Four times a day (QID) | ORAL | Status: DC | PRN
  Administered 2012-03-31 – 2012-04-03 (×9): 50 mg via ORAL
  Filled 2012-03-31 (×9): qty 1

## 2012-03-31 NOTE — H&P (Signed)
Terri Montgomery is an 51 y.o. female.  Chief Complaint: left arm pain  HPI:  Patient is a 51 y/o hispanic female c/o left arm pain and weakness, as well as left mouth facial droop, blurred vision, headache, neck pain, fatigue, stress and sadness.  She had a parathyroidectomy done on 01/21/12. Her last calcium level was 3/28 and was 9.9. Patient awoke Saturday morning with inability to move her left arm associated with above symptoms. She has no history of CVA or heart disease. She only takes ibuprofen PRN.  The patient is a non-smoker. No alleviating or aggravating factors. The course is worsening.  Past Medical History   Diagnosis  Date   .  Hypertrophic pyloric stenosis    .  GERD (gastroesophageal reflux disease)    .  Anemia    .  Arthritis    .  Heart murmur    .  PONV (postoperative nausea and vomiting)      HA post anesthesia   .  Primary hyperparathyroidism  12/19/2011     Underwent removal of a hyperplastic left superior parathyroid March 2013, with improvement in symptoms   .  Mastodynia, female  10/23/2011     Bilateral; worse on L side (Left outer inferior quadrant)    Past Surgical History   Procedure  Date   .  Abdominal hysterectomy  2002   .  Cholecystectomy  1998   .  Parathyroidectomy  01/21/2012     Procedure: PARATHYROIDECTOMY; Surgeon: Currie Paris, MD; Location: [facility] OR; Service: General; Laterality: N/A; Left superior parathyroidectomy    Family History   Problem  Relation  Age of Onset   .  Heart disease  Daughter    .  Heart disease  Maternal Grandmother    .  Heart disease  Paternal Grandmother    .  Anesthesia problems  Neg Hx    Social History: reports that she has never smoked. She does not have any smokeless tobacco history on file. She reports that she does not drink alcohol or use illicit drugs.  Allergies:  Allergies   Allergen  Reactions   .  Demerol  Shortness Of Breath   .  Iodine  Shortness Of Breath   .  Penicillins  Shortness Of Breath   .   Imitrex (Sumatriptan Base)  Other (See Comments)     Increases HA   .  Morphine And Related  Nausea And Vomiting   ROS  No fevers, chills or night sweats.  No GI/GU symptoms.  No MSK symptoms other than left arm weakness.  No syncope. Reports stress and current depression.  Blood pressure 144/77, pulse 58, temperature 98.2 F (36.8 C), temperature source Oral, height 5\' 2"  (1.575 m), weight 145 lb 4 oz (65.885 kg).  Physical Exam  Lungs: Normal respiratory effort, chest expands symmetrically. Lungs are clear to auscultation, no crackles or wheezes.  Heart - Regular rate and rhythm. No murmurs, gallops or rubs.  Pulses: equal in left and right arm.  Eye - Pupils Equal Round Reactive to light, Extraocular movements intact, Fundi without hemorrhage or visible lesions, Conjunctiva without redness or discharge  Nose: External nasal examination shows no deformity or inflammation. Nasal mucosa are pink and moist without lesions or exudates. No septal dislocation or dislocation.No obstruction to airflow.  Mouth - no lesions, mucous membranes are moist, no decaying teeth. On smile patient has left facial droop.  Neck: No deformities, thyromegaly, masses, or tenderness noted. Supple with full range of  motion without pain.  Neurologic exam :  Cn 2-7 intact CN 12 abnormal with poor shoulder shrug on the left side.  Strength not equal in upper ext. Left arm is 0/5 strength. She can move her fingers, but has no grasp/finger pincer.  She has left lower facial droop of the mouth.  Able to walk on heels and toes.  Balance normal  Romberg normal, finger to nose  Reflexes normal in patellar, biceps, triceps.  Skin: Intact without suspicious lesions or rashes  Psych: Cognition and judgment appear intact. Alert, communicative and cooperative with normal attention span and concentration. No apparent delusions, illusions, hallucinations. She is sad and tearful.   Assessment/Plan  51 y/o hispanic female with  left facial droop and left arm paresis with possible CVA also complaining of headache, and chest pain. EKG normal.  1. Left Arm weakness/facial droop.  Right CVA/Subarachnoid hemorrhage likely. Will obtain CT head. >48 hours. Not hypertensive.  Possible conversion disorder Secondary to stress - unknown reason, but patient states she is very stressed out.  2. Chest pain  EKG normal here in office. Typical pain with left arm and neck pain as well as central chest pressure.  Will admit to telemetry and cycle enzymes.  3. Headache  CT head  Possible subarachnoid hemorrhage.  Had "hard acute head pain with neck pain"  4. Recent parathyroid surgery  Lab Results   Component  Value  Date    CALCIUM  9.9  01/22/2012   Will order a BMP.  5. Dispo: inpatient for Stroke workup, chest pain rule out.  Discussed with Inpatient team.   Edd Arbour  03/30/2012, 3:40 PM  I interviewed and examined Mrs Hadlock and discussed her evaluation with Dr Rivka Safer. Wayne A. Sheffield Slider, MD.

## 2012-03-31 NOTE — Evaluation (Addendum)
Physical Therapy Evaluation Patient Details Name: Terri Montgomery MRN: [id number] DOB: 27-Feb-1961 Today's Date: 03/31/2012 Time: 4098-1191 PT Time Calculation (min): 30 min  PT Assessment / Plan / Recommendation Clinical Impression  Pt admitted with L UE weakness, neck pain, and chest pain, currently undergoing work up to determine cause. NIH upon arrival 5, later assessed at 7. CT negative for CVA, pending MRI. Pt reportedly under stress and anxiety, so considering differential diagnosis of conversion disorder. At time of eval, pt presents with decreased motor activation in LUE, generalized weakness, decreased functional mobility and balance. Pt will benefit from skilled PT to address these impairments and return home safely with S/assist from spouse. Pt may benefit from BERG next tx as well as trial cane for stability with gait and stairs    PT Assessment  Patient needs continued PT services    Follow Up Recommendations  Home health PT (?) ;Supervision/Assistance - 24 hour    Barriers to Discharge Decreased caregiver support (Uncertain if spouse available 24/7)      lEquipment Recommendations  Other (comment) (TBD - cane?)         Frequency Min 3X/week    Precautions / Restrictions Precautions Precautions: Fall Precaution Comments: L UE weakness Restrictions Weight Bearing Restrictions: No   Pertinent Vitals/Pain HR WNF throughout      Mobility  Bed Mobility Bed Mobility: Rolling Left;Left Sidelying to Sit Rolling Left: 5: Supervision Left Sidelying to Sit: 4: Min guard;HOB flat Details for Bed Mobility Assistance: Pt able to carry LUE along for rolling without cues and able to come sidelying to sit with increased effort and time, but no assist needed.  Transfers Transfers: Sit to Stand;Stand to Sit Sit to Stand: 4: Min assist;From bed;With upper extremity assist (R UE assist) Stand to Sit: 4: Min guard;With upper extremity assist;To chair/3-in-1 Details for Transfer  Assistance: Pt heavily relying on R UE for coming to stand. Once up standing, pt tending to lean more to L, with decreased R weight shift. When cued to bring equal weight between both LEs, pt felt as though she was. But when asked to shift to R, she was able to do so and come to equal stance.  Ambulation/Gait Ambulation/Gait Assistance: 4: Min assist Ambulation Distance (Feet): 120 Feet Assistive device: None Ambulation/Gait Assistance Details: Steadying assist in busy environment initially, especially with turns. No LOB. Pt reaching across with R to hold LUE. Gait Pattern: Step-through pattern;Decreased stride length;Shuffle;Trunk rotated posteriorly on left Gait velocity: 34ft/sec General Gait Details: Pt with inconsisent ability to clean LEs from ground, shuffling at times, then walking normally. Somewhat flexed trunk and rotated L.  Stairs: Yes Stairs Assistance: 4: Min guard Stair Management Technique: One rail Left;One rail Right;Step to pattern;Forwards (L rail up, R rail down. ) Number of Stairs: 4  Wheelchair Mobility Wheelchair Mobility: No Modified Rankin (Stroke Patients Only) Pre-Morbid Rankin Score: No symptoms Modified Rankin: Slight disability         PT Diagnosis: Generalized weakness;Difficulty walking  PT Problem List: Decreased strength;Decreased range of motion;Decreased activity tolerance;Decreased balance;Decreased mobility;Decreased knowledge of use of DME;Decreased safety awareness;Impaired sensation PT Treatment Interventions: DME instruction;Gait training;Stair training;Functional mobility training;Therapeutic activities;Therapeutic exercise;Balance training;Neuromuscular re-education;Patient/family education   PT Goals Acute Rehab PT Goals PT Goal Formulation: With patient Time For Goal Achievement: 04/07/12 Potential to Achieve Goals: Good Pt will go Supine/Side to Sit: with modified independence;with HOB 0 degrees PT Goal: Supine/Side to Sit - Progress:  Goal set today Pt will go Sit to Supine/Side: with  modified independence;with HOB 0 degrees PT Goal: Sit to Supine/Side - Progress: Goal set today Pt will go Sit to Stand: with modified independence;with upper extremity assist PT Goal: Sit to Stand - Progress: Goal set today Pt will go Stand to Sit: with modified independence;with upper extremity assist PT Goal: Stand to Sit - Progress: Goal set today Pt will Ambulate: >150 feet;with modified independence;with least restrictive assistive device PT Goal: Ambulate - Progress: Goal set today Pt will Go Up / Down Stairs: 3-5 stairs;with supervision;with least restrictive assistive device PT Goal: Up/Down Stairs - Progress: Goal set today  Visit Information  Last PT Received On: 03/31/12 Assistance Needed: +1    Subjective Data  Subjective: Sleepy Patient Stated Goal: Get better   Prior Functioning  Home Living Lives With: Spouse Available Help at Discharge: Available PRN/intermittently Type of Home: House Home Access: Stairs to enter [employer] of Steps: 5 Entrance Stairs-Rails: Right Home Layout: One level Bathroom Shower/Tub: Tub/shower unit;Walk-in shower Bathroom Toilet: Standard Home Adaptive Equipment: None Prior Function Level of Independence: Independent Able to Take Stairs?: Reciprically Driving: Yes Vocation: Unemployed Communication Communication: No difficulties Dominant Hand: Right    Cognition  Overall Cognitive Status: Appears within functional limits for tasks assessed/performed Arousal/Alertness: Lethargic Orientation Level: Appears intact for tasks assessed Behavior During Session: [facility] for tasks performed Cognition - Other Comments: Initially lethargic, but more alert as up and walking    Extremity/Trunk Assessment Right Upper Extremity Assessment RUE ROM/Strength/Tone: Within functional levels RUE Sensation: WFL - Light Touch RUE Coordination: WFL - gross/fine motor Left Upper Extremity  Assessment LUE ROM/Strength/Tone: Deficits LUE ROM/Strength/Tone Deficits: Pt unable to volitionally activate LUE for MMT, but did observe some inconsistent activation during functional tasks - putting on glasses and during mobility LUE Sensation: Deficits LUE Sensation Deficits: reports "needle-like" sensation and pain traveling along sternocleidomastoid down across left breast, under breast and to left side then down left bicep.  LUE Coordination: Deficits Right Lower Extremity Assessment RLE ROM/Strength/Tone: Deficits RLE ROM/Strength/Tone Deficits: Decreased activation with MMT grossly 3-/5, but performace varied between trials, demonstrating inconsisent strength with repeated marching, LAQ, and PF/DF. Difficult to accurately assess, but functional strength present.  RLE Sensation: Deficits RLE Sensation Deficits: Pt reports numb middle toe on R LE only. Otherwise, intact RLE Coordination: WFL - gross/fine motor Left Lower Extremity Assessment LLE ROM/Strength/Tone: Deficits LLE ROM/Strength/Tone Deficits: Decreased activation with MMT grossly 3-/5, but performace varied between trials, demonstrating inconsisent strength with repeated marching, LAQ, and PF/DF.  LLE Sensation: WFL - Light Touch LLE Coordination: WFL - gross/fine motor Trunk Assessment Trunk Assessment: Normal   Balance Balance Balance Assessed: Yes Static Sitting Balance Static Sitting - Balance Support: No upper extremity supported;Feet supported Static Sitting - Level of Assistance: 5: Stand by assistance Dynamic Sitting Balance Dynamic Sitting - Balance Support: No upper extremity supported;Feet supported Dynamic Sitting - Level of Assistance: 5: Stand by assistance Static Standing Balance Static Standing - Balance Support: No upper extremity supported Static Standing - Level of Assistance: 4: Min assist Dynamic Standing Balance Dynamic Standing - Balance Support: No upper extremity supported Dynamic Standing -  Level of Assistance: 4: Min assist  End of Session PT - End of Session Equipment Utilized During Treatment: Gait belt Activity Tolerance: Patient limited by fatigue Patient left: in chair;with call bell/phone within reach;with family/visitor present Nurse Communication: Mobility status   Virl Cagey, PT 03/31/2012, 2:15 PM

## 2012-03-31 NOTE — Evaluation (Signed)
Occupational Therapy Evaluation Patient Details Name: Terri Montgomery MRN: [id number] DOB: 1961-01-24 Today's Date: 03/31/2012 Time:  -     OT Assessment / Plan / Recommendation Clinical Impression  Pt admitted with L UE weakness, neck pain, and chest pain, currently undergoing work up to determine cause. NIH upon arrival 5, later assessed at 7. CT negative for CVA, pending MRI. Pt reportedly under stress and anxiety, so considering differential diagnosis of conversion disorder. Pt presents with Left sided weakness, pain and decreased sensation limiting mobility and independence with ADLs. Pt expressed her concern over this being related to recent parathryoid sx; she states she had reported similar tingling along the same distribution to MD before sx. Pt will benefit from skilled OT in the acute setting to maximize I with ADL and ADL mobility prior to d/c.  Of note: pt initially presenting with "flaccid" LUE, but upon testing, pt able to move all joints of LUE with pain being more proximal and weakness more distal. The only movement pt unable to perform was abduction as this was extremely painful to patient. Pt guarding LUE closely especially with mobility suggesting some level of true pain. Spoke to pt about ability to move LUE and encourage her to do so often during the day- pt verbalized understanding. However, upon PT eval (later in the afternoon), pt again initially presenting as "flaccid" but using LUE functionally.      OT Assessment  Patient needs continued OT Services    Follow Up Recommendations  Home health OT (vs OPOT pending hospital course and avail transportation)    Barriers to Discharge Decreased caregiver support Unsure of available assist at home  Equipment Recommendations   (TBD)    Recommendations for Other Services    Frequency  Min 2X/week    Precautions / Restrictions Precautions Precautions: Fall Precaution Comments: L UE weakness Restrictions Weight Bearing  Restrictions: No   Pertinent Vitals/Pain Pt reports pain 9/10 upper left shoulder; premedicated    ADL  Grooming: Performed;Wash/dry face;Set up;Supervision/safety Where Assessed - Grooming: Unsupported sitting Lower Body Dressing: Simulated;Moderate assistance Where Assessed - Lower Body Dressing: Sopported sit to stand Toilet Transfer: Simulated;Min guard (to/from EOB) Toilet Transfer Method: Sit to stand Toileting - Architect and Hygiene: Simulated;Minimal assistance Where Assessed - Engineer, mining and Hygiene: Standing Transfers/Ambulation Related to ADLs: Min HHA with ambulation throughout room    OT Diagnosis: Generalized weakness;Acute pain;Hemiplegia non-dominant side  OT Problem List: Decreased strength;Decreased activity tolerance;Impaired balance (sitting and/or standing);Impaired vision/perception;Decreased coordination;Decreased knowledge of use of DME or AE;Decreased knowledge of precautions;Pain;Impaired UE functional use;Impaired sensation;Increased edema OT Treatment Interventions: Self-care/ADL training;Therapeutic exercise;DME and/or AE instruction;Therapeutic activities;Patient/family education;Balance training;Visual/perceptual remediation/compensation   OT Goals Acute Rehab OT Goals OT Goal Formulation: With patient Time For Goal Achievement: 04/07/12 Potential to Achieve Goals: Fair ADL Goals Pt Will Perform Grooming: with set-up;Standing at sink ADL Goal: Grooming - Progress: Goal set today Pt Will Perform Upper Body Bathing: with supervision;Sitting, chair;Sitting at sink;Standing at sink ADL Goal: Upper Body Bathing - Progress: Goal set today Pt Will Perform Lower Body Bathing: with supervision;with set-up;Sit to stand from chair;Sit to stand from bed ADL Goal: Lower Body Bathing - Progress: Goal set today Pt Will Perform Lower Body Dressing: with min assist;Sit to stand from bed;Sit to stand from chair ADL Goal: Lower Body  Dressing - Progress: Goal set today Pt Will Transfer to Toilet: Independently;with modified independence;with DME;Ambulation ADL Goal: Toilet Transfer - Progress: Goal set today Pt Will Perform Toileting - Clothing Manipulation:  Independently;Standing ADL Goal: Toileting - Clothing Manipulation - Progress: Goal set today Pt Will Perform Toileting - Hygiene: Independently;Sitting on 3-in-1 or toilet ADL Goal: Toileting - Hygiene - Progress: Goal set today Pt Will Perform Tub/Shower Transfer: Shower transfer;Ambulation;with supervision ADL Goal: Web designer - Progress: Goal set today Additional ADL Goal #1: Pt will be I with LUE HEP to improve strength and coordination in prep for ADLs ADL Goal: Additional Goal #1 - Progress: Goal set today Additional ADL Goal #2: Pt will actively use LUE in bilateral hand tasks with Min VC's ADL Goal: Additional Goal #2 - Progress: Goal set today  Visit Information  Last OT Received On: 03/31/12 Assistance Needed: +1    Subjective Data  Subjective: I'm doing a little better today Patient Stated Goal: Return home.   Prior Functioning  Home Living Lives With: Spouse Available Help at Discharge: Available PRN/intermittently Type of Home: House Home Access: Stairs to enter [employer] of Steps: 5 Entrance Stairs-Rails: Right Home Layout: One level Bathroom Shower/Tub: Tub/shower unit;Walk-in shower Bathroom Toilet: Standard Home Adaptive Equipment: None Prior Function Level of Independence: Independent Able to Take Stairs?: Reciprically Driving: Yes Vocation: Unemployed Communication Communication: No difficulties Dominant Hand: Right    Cognition  Overall Cognitive Status: Appears within functional limits for tasks assessed/performed Arousal/Alertness: Lethargic Orientation Level: Appears intact for tasks assessed Behavior During Session: [facility] for tasks performed Cognition - Other Comments: Initially lethargic, but more  alert as up and walking    Extremity/Trunk Assessment Right Upper Extremity Assessment RUE ROM/Strength/Tone: Within functional levels RUE Sensation: WFL - Light Touch RUE Coordination: WFL - gross/fine motor Left Upper Extremity Assessment LUE ROM/Strength/Tone: Deficits LUE ROM/Strength/Tone Deficits: Pt initially demonstrating "flaccid" LUE, but once began testing and encouraging movement, pt with: 2+/5 grip; 3/5 wrist flexion; 4/5 wrist extension; 3+/5 bicep; 4-/5 tricep; 4/5 supination/pronation; 4-/5 shoulder shrug LUE Sensation: Deficits LUE Sensation Deficits: reports "needle-like" sensation and pain traveling along sternocleidomastoid down across left breast, under breast and to left side then down left bicep.  LUE Coordination: Deficits LUE Coordination Deficits: limited by "weakness" Right Lower Extremity Assessment RLE ROM/Strength/Tone: Deficits RLE ROM/Strength/Tone Deficits: Decreased activation with MMT grossly 3-/5, but performace varied between trials, demonstrating inconsisent strength with repeated marching, LAQ, and PF/DF. Difficult to accurately assess, but functional strength present.  RLE Sensation: Deficits RLE Sensation Deficits: Pt reports numb middle toe on R LE only. Otherwise, intact RLE Coordination: WFL - gross/fine motor Left Lower Extremity Assessment LLE ROM/Strength/Tone: Deficits LLE ROM/Strength/Tone Deficits: Decreased activation with MMT grossly 3-/5, but performace varied between trials, demonstrating inconsisent strength with repeated marching, LAQ, and PF/DF.  LLE Sensation: WFL - Light Touch LLE Coordination: WFL - gross/fine motor Trunk Assessment Trunk Assessment: Normal   Mobility Bed Mobility Bed Mobility: Rolling Left;Left Sidelying to Sit Rolling Left: 5: Supervision Left Sidelying to Sit: 4: Min guard;HOB flat Details for Bed Mobility Assistance: Pt able to carry LUE along for rolling without cues and able to come sidelying to sit with  increased effort and time, but no assist needed.  Transfers Sit to Stand: 4: Min assist;From bed;With upper extremity assist (R UE assist) Stand to Sit: 4: Min guard;With upper extremity assist;To chair/3-in-1     Exercise    Balance Balance Balance Assessed: Yes Static Sitting Balance Static Sitting - Balance Support: No upper extremity supported;Feet supported Static Sitting - Level of Assistance: 5: Stand by assistance Dynamic Sitting Balance Dynamic Sitting - Balance Support: No upper extremity supported;Feet supported Dynamic Sitting -  Level of Assistance: 5: Stand by assistance Static Standing Balance Static Standing - Balance Support: No upper extremity supported Static Standing - Level of Assistance: 4: Min assist Dynamic Standing Balance Dynamic Standing - Balance Support: No upper extremity supported Dynamic Standing - Level of Assistance: 4: Min assist  End of Session OT - End of Session Equipment Utilized During Treatment: Gait belt Activity Tolerance: Patient limited by pain Patient left: in bed;with call bell/phone within reach Nurse Communication: Mobility status   Lemarcus Baggerly 03/31/2012, 2:47 PM

## 2012-03-31 NOTE — Evaluation (Signed)
Speech Language Pathology Evaluation Patient Details Name: Terri Montgomery MRN: [id number] DOB: Jan 23, 1961 Today's Date: 03/31/2012 Time: 4098-1191 SLP Time Calculation (min): 14 min  Problem List:  Patient Active Problem List  Diagnoses  . Abdominal pain, RLQ  . Fatigue  . Primary hyperparathyroidism  . Left arm weakness  . Facial droop  . Chest pain  . Headache  . Blurred vision   Past Medical History:  Past Medical History  Diagnosis Date  . Hypertrophic pyloric stenosis   . GERD (gastroesophageal reflux disease)   . Anemia   . Arthritis   . Heart murmur   . PONV (postoperative nausea and vomiting)     HA post anesthesia  . Primary hyperparathyroidism 12/19/2011    Underwent removal of a hyperplastic left superior parathyroid March 2013, with improvement in symptoms   . 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 Abdominal pain, RLQ 09/03/2011    Patient with long history of abdominal pain 1999-2006, previously with pain in RLQ, told she would "need surgery" in [place] before moving to [place] in 2008.  Never had this surgery that had been recommended.  S/p ccy, TAH c BSO for fibromas (no cancer history).    Past Surgical History:  Past Surgical History  Procedure Date  . Abdominal hysterectomy 2002  . Cholecystectomy 1998  . Parathyroidectomy 01/21/2012    Procedure: PARATHYROIDECTOMY;  Surgeon: Currie Paris, MD;  Location: [facility] OR;  Service: General;  Laterality: N/A;  Left superior parathyroidectomy    Assessment / Plan / Recommendation Clinical Impression  51 y.o. female admitted 5/20 with chest pain, left arm pain and left sided weakness.  CT of brain was negative, await MRI.  Patient presents with no cognitive-linguistic deficits at this time.  RN reported that patient has reported a right gaze preference, however SLP stood on her left side with no evidence of a neglect or inattention, had adequate eye contact.  Noteworthy: patient reported right toe numbness beginning within the last 30 minutes prior to my arrival.  RN aware.    SLP Assessment  Patient does not need any further Speech Language Pathology Services    Follow Up Recommendations  None    Pertinent Vitals/Pain n/a    SLP Evaluation Prior Functioning  Cognitive/Linguistic Baseline: Within functional limits Type of Home: House Lives With: Spouse Vocation: Unemployed   Cognition  Overall Cognitive Status: Appears within functional limits for tasks assessed Arousal/Alertness: Awake/alert Orientation Level: Oriented X4 Attention: Selective Selective Attention: Appears intact Memory: Appears intact Awareness: Appears intact Problem Solving: Appears intact Safety/Judgment: Appears intact    Comprehension  Auditory Comprehension Overall Auditory Comprehension: Appears within functional limits for tasks assessed Reading Comprehension Reading Status: Within funtional limits    Expression Expression Primary Mode of Expression: Verbal Verbal Expression Overall Verbal Expression: Appears within functional limits for tasks assessed Pragmatics: No impairment Written Expression Dominant Hand: Right Written Expression: Not tested   Oral / Motor Oral Motor/Sensory Function Overall Oral Motor/Sensory Function: Appears within functional limits for tasks assessed Motor Speech Overall Motor Speech: Appears within functional limits for tasks assessed     Myra Rude, M.S.,CCC-SLP Pager [phone] 03/31/2012, 8:41 AM

## 2012-03-31 NOTE — Progress Notes (Signed)
Subjective: Strength in left hand is improved although not back completely. She has complains of 8/10 pain in left neck that radiates to her arm.   Objective: Vital signs in last 24 hours: Temp:  [97.4 F (36.3 C)-98.4 F (36.9 C)] 98 F (36.7 C) (05/21 0600) Pulse Rate:  [51-72] 51  (05/21 0600) Resp:  [18] 18  (05/21 0600) BP: (108-148)/(64-89) 117/64 mmHg (05/21 0600) SpO2:  [96 %-99 %] 99 % (05/21 0600) Weight:  [145 lb 4 oz (65.885 kg)] 145 lb 4 oz (65.885 kg) (05/20 2200) Weight change:  Last BM Date: 03/30/12  Intake/Output from previous day: 05/20 0701 - 05/21 0700 In: 123 [P.O.:120; I.V.:3] Out: -  Intake/Output this shift: Total I/O In: 220 [P.O.:220] Out: -   General: alert and oriented, appears fatigued  HEENT: PERRLA, EOMI, MMM CV: S1S2, RRR, no murmurs Pulm: CTA B/L  GI: present bowel sounds, no tenderness Neuro: 3/5 strength in left hand, 0/5 strength in left arm, 5/5 strength in right arm and hand. 4/5 strength in left leg and 5/5 strength in right leg, sensation to light touch intact in all extremities, CN2-12 grossly intact except for minimal facial droop to the left unchanged from yesterday. Peripheral vision intact bilaterally MSK: tenderness to palpation along left chest wall.   Lab Results:  Basename 03/31/12 0540 03/30/12 1829  WBC 8.5 8.0  HGB 12.9 13.2  HCT 37.9 38.0  PLT 335 336   BMET  Basename 03/31/12 0540 03/30/12 1829  NA 141 142  K 3.7 3.7  CL 104 106  CO2 26 26  GLUCOSE 96 95  BUN 14 10  CREATININE 0.70 0.66  CALCIUM 10.1 10.3    Studies/Results: Ct Head Wo Contrast  03/30/2012  *RADIOLOGY REPORT*  Clinical Data: Left arm weakness, left facial droop, blurred vision and headache.  CT HEAD WITHOUT CONTRAST  Technique:  Contiguous axial images were obtained from the base of the skull through the vertex without contrast.  Comparison: None.  Findings: The brain demonstrates no evidence of hemorrhage, infarction, edema, mass effect,  extra-axial fluid collection, hydrocephalus or mass lesion.  The skull is unremarkable.  IMPRESSION: Normal head CT.  Original Report Authenticated By: Reola Calkins, M.D.    Medications:  Scheduled:   .  stroke: mapping our early stages of recovery book   Does not apply Once  . sodium chloride  3 mL Intravenous Q12H  . DISCONTD: ondansetron  4 mg Oral Once   OZH:YQMVHQ chloride, acetaminophen, acetaminophen, cyclobenzaprine, ondansetron (ZOFRAN) IV, ondansetron, senna-docusate, sodium chloride, traMADol  Assessment/Plan: 51 y/o hispanic female with left facial droop and left arm paresis with possible CVA also complaining of headache, and chest pain. EKG normal.  1. Left Arm weakness/facial droop. CT was negative for acute bleed.  Continues to have weakness in left arm, not associated with any loss of sensation or numbness. Given lack of resolution of symptoms, will obtain MRI neck and brain to rule out ischemic stroke. This could be conversion disorder given increased stress and anxiety but need to rule out ischemic stroke first.  - lipid panel is normal -A1C pending - TSH pending  2. Chest pain   cardiac enzymes have been negative x3 EKG grossly unchanged from previous this morning 3. Headache - resolved. Has history of migraines.  4. Recent parathyroid surgery: calcium today is 10.1 Lab Results   Component  Value  Date    CALCIUM  9.9  01/22/2012   Will order a BMP.  5. Dispo:  pending workup   LOS: 1 day   Elleen Coulibaly 03/31/2012, 9:34 AM

## 2012-03-31 NOTE — H&P (Signed)
I interviewed and examined this patient and discussed the care plan with Dr. Rivka Safer and the [facility] team and agree with assessment and plan as documented in the admission note for today.    Carlena Ruybal A. Sheffield Slider, MD Family Medicine Teaching Service Attending  03/31/2012 5:40 PM

## 2012-03-31 NOTE — Progress Notes (Signed)
Family Medicine Teaching Service Attending Note  I interviewed and examined patient Terri Montgomery and reviewed their tests and x-rays.  I discussed with Dr. Gwenlyn Saran and reviewed their note for today.  I agree with their assessment and plan.     Additionally  This AM feels still can not move her left hand and her left leg only a little Still some discomfort in her left neck Mildly sleepy Can curl fingers on left hand but no other left upper extrem movement 4/5 ankle flex exten on left  Full strength on R No noted facial droop Eye - Pupils Equal Round Reactive to light, Extraocular movements intact, Fundi without hemorrhage or visible lesions, Conjunctiva without redness or discharge  Left sided weakness with normal head CT.   MRI/A and neurology consult

## 2012-04-01 ENCOUNTER — Inpatient Hospital Stay (HOSPITAL_COMMUNITY): Payer: Commercial Managed Care - PPO

## 2012-04-01 ENCOUNTER — Encounter (HOSPITAL_COMMUNITY): Payer: Self-pay | Admitting: *Deleted

## 2012-04-01 DIAGNOSIS — G8320 Monoplegia of upper limb affecting unspecified side: Secondary | ICD-10-CM

## 2012-04-01 DIAGNOSIS — M79609 Pain in unspecified limb: Secondary | ICD-10-CM

## 2012-04-01 LAB — CBC
HCT: 36.7 % (ref 36.0–46.0)
Hemoglobin: 12.7 g/dL (ref 12.0–15.0)
MCHC: 34.6 g/dL (ref 30.0–36.0)
WBC: 9.8 10*3/uL (ref 4.0–10.5)

## 2012-04-01 LAB — BASIC METABOLIC PANEL
BUN: 13 mg/dL (ref 6–23)
Chloride: 104 mEq/L (ref 96–112)
GFR calc Af Amer: >90 mL/min (ref >90)
Potassium: 3.6 mEq/L (ref 3.5–5.1)

## 2012-04-01 NOTE — Progress Notes (Signed)
FMTS Attending/Primary MD Note  I saw Terri Montgomery today and reviewed resident note; I agree with assessment/plan.  She and daughter (in room) cite very stressful visit to [place] and disagreement with patient's mother as associated events to this episode.  She continues to have pain in the left neck/lateral L breast and left humerus.  Pain and weakness limit her ability to move the L arm.  Is able to walk with minimal assistance.  Would not underestimate influence of recent emotional stress on this acute process. To consider treatment for possible neuropathic etiology, versus conversion disorder pending input from neurology service.  Paula Compton, MD

## 2012-04-01 NOTE — Progress Notes (Signed)
OT Cancellation Note  Treatment cancelled today due to patient receiving procedure or test.  Will re-attempt 04/02/12  Jeani Hawking M [phone] 04/01/2012, 4:34 PM

## 2012-04-01 NOTE — Progress Notes (Signed)
Physical Therapy Treatment Patient Details Name: Terri Montgomery MRN: [id number] DOB: [birth year] Today's Date: 04/01/2012 Time: 4782-9562 PT Time Calculation (min): 29 min  PT Assessment / Plan / Recommendation Comments on Treatment Session  Patient with increased pain wiht positioning of LUE. Called nurse in as noted LUE swollen and tender to touch on medial aspect of bicep. Spoke with OT as patient stated that it hurts "all over" and rubbing shoulder girdle as she states. Patient noted to have "flaccidity" in LUE however I believe patient is not using LUE due more to guarding and pain vs.flaccidity    Follow Up Recommendations  Home health PT;Supervision/Assistance - 24 hour    Barriers to Discharge        Equipment Recommendations  None recommended by PT    Recommendations for Other Services    Frequency Min 3X/week   Plan Discharge plan remains appropriate    Precautions / Restrictions Precautions Precautions: Fall   Pertinent Vitals/Pain 8/10 LUE    Mobility  Bed Mobility Bed Mobility: Sit to Supine Sit to Supine: 6: Modified independent (Device/Increase time) Transfers Sit to Stand: 5: Supervision;With upper extremity assist;From chair/3-in-1 Stand to Sit: 5: Supervision;With upper extremity assist;To chair/3-in-1 Details for Transfer Assistance: Patient continues to rely on R UE with sit to stand. Patient bringing weight up evenly today on B LE.  Ambulation/Gait Ambulation/Gait Assistance: 4: Min guard Ambulation Distance (Feet): 225 Feet Assistive device: None Ambulation/Gait Assistance Details: Patient mildly unsteady with ambulation however no significant LOB. Attempted to test DGI but patient with increased fatique and requested to go back to bed Gait Pattern: Decreased stride length Stairs: Yes Stairs Assistance: 4: Min assist Stairs Assistance Details (indicate cue type and reason): Min A to control balance with descent down 5 inch step Stair Management  Technique: One rail Right;Step to pattern;Forwards    Exercises     PT Diagnosis:    PT Problem List:   PT Treatment Interventions:     PT Goals Acute Rehab PT Goals PT Goal: Sit to Supine/Side - Progress: Met PT Goal: Sit to Stand - Progress: Progressing toward goal PT Goal: Stand to Sit - Progress: Progressing toward goal PT Goal: Ambulate - Progress: Progressing toward goal PT Goal: Up/Down Stairs - Progress: Progressing toward goal  Visit Information  Last PT Received On: 04/01/12 Assistance Needed: +1    Subjective Data      Cognition  Overall Cognitive Status: Appears within functional limits for tasks assessed/performed Arousal/Alertness: Awake/alert Orientation Level: Appears intact for tasks assessed Behavior During Session: [facility] for tasks performed    Balance     End of Session PT - End of Session Equipment Utilized During Treatment: Gait belt Activity Tolerance: Patient limited by fatigue Patient left: in bed;with call bell/phone within reach;with family/visitor present;with nursing in room Nurse Communication: Mobility status    Fredrich Birks 04/01/2012, 3:01 PM 04/01/2012 Fredrich Birks PTA [phone] pager [phone] office

## 2012-04-01 NOTE — Progress Notes (Signed)
Subjective: Strength in hand and forearm is improving since yesterday but she is still unable to move her shoulder and upper arm. Has been having 10/10 pain in neck radiating to her chest wall and arm. No numbness, no tingling. Strength in left leg is improved.   Objective: Vital signs in last 24 hours: Temp:  [97.7 F (36.5 C)-98.6 F (37 C)] 97.7 F (36.5 C) (05/22 0908) Pulse Rate:  [56-66] 59  (05/22 0908) Resp:  [18] 18  (05/22 0908) BP: (104-123)/(69-82) 112/75 mmHg (05/22 0908) SpO2:  [95 %-98 %] 97 % (05/22 0908) Weight change:  Last BM Date: 03/31/12  Intake/Output from previous day: 05/21 0701 - 05/22 0700 In: 943 [P.O.:940; I.V.:3] Out: -  Intake/Output this shift:    General: alert and oriented, fatigued  HEENT: PERRLA, EOMI, MMM CV: S1S2, RRR, no murmurs Pulm: CTA B/L  GI: present bowel sounds, no tenderness Neuro: 3/5 strength in left hand, able to move her left forearm 3/5 strength in forearm, 0/5 left deltoid, 5/5 strength in right arm and hand. 4+/5 strength in left leg and 5/5 strength in right leg, sensation to light touch intact in all extremities, CN2-12 grossly intact. Brachioradialis reflexes difficult to illicit bilaterally MSK: no tenderness in chest wall. Tenderness to palpation along left arm.   Lab Results:  Basename 04/01/12 0625 03/31/12 0540  WBC 9.8 8.5  HGB 12.7 12.9  HCT 36.7 37.9  PLT 309 335   BMET  Basename 04/01/12 0625 03/31/12 0540  NA 139 141  K 3.6 3.7  CL 104 104  CO2 26 26  GLUCOSE 97 96  BUN 13 14  CREATININE 0.63 0.70  CALCIUM 9.7 10.1    Studies/Results: Ct Head Wo Contrast  03/30/2012  *RADIOLOGY REPORT*  Clinical Data: Left arm weakness, left facial droop, blurred vision and headache.  CT HEAD WITHOUT CONTRAST  Technique:  Contiguous axial images were obtained from the base of the skull through the vertex without contrast.  Comparison: None.  Findings: The brain demonstrates no evidence of hemorrhage, infarction,  edema, mass effect, extra-axial fluid collection, hydrocephalus or mass lesion.  The skull is unremarkable.  IMPRESSION: Normal head CT.  Original Report Authenticated By: Reola Calkins, M.D.   Mr Maxine Glenn Head Wo Contrast  03/31/2012  *RADIOLOGY REPORT*  Clinical Data:  Left arm numbness, blurred vision and facial droop 2 days ago.  Pain left sided neck.  MRI HEAD WITH AND WITHOUT CONTRAST MRA HEAD WITHOUT CONTRAST MRA NECK WITHOUT AND WITH CONTRAST  Technique: Multiplanar, multiecho pulse sequences of the brain and surrounding structures were obtained according to standard protocol with and without intravenous contrast.  Angiographic images of the Circle of Willis were obtained using MRA technique without intravenous contrast.  Angiographic images of the neck were obtained using MRA technique without and with intravenous contrast.  Contrast:17 ml MultiHance.  Comparison:03/30/2012 head CT.  No comparison brain MR.  MRI HEAD  Findings: No acute infarct.  No intracranial hemorrhage.  No intracranial mass. Either prominent vein or small developmental venous anomaly extends from the posterior right basal ganglia to the right periatrial region.  This is felt to be an incidental finding.  Scattered small number of punctate nonspecific white matter type changes most notable frontal lobes.  This may be related to migraine headaches or small vessel disease.  Other causes include that secondary to vasculitis or inflammatory process.  With the patient's age and gender, demyelinating process not entirely excluded although the appearance is nonspecific for such.  Left sphenoid sinus opacification with central inspissated material suspected.  IMPRESSION: No acute infarct.  Scattered nonspecific white matter type changes as detailed above.  Opacification left sphenoid sinus air cell with central inspissated material.  MRA HEAD  Findings:Anterior circulation without medium or large size vessel significant stenosis or occlusion.   Ectatic vertebral arteries and basilar artery without significant stenosis.  Nonvisualization left AICA.  Moderate narrowing portions of the superior cerebellar arteries greater on the left.  Branch vessel irregularity.  No aneurysm or vascular malformation noted.  IMPRESSION: Mild branch vessel irregularity.  MRA NECK  Findings:Normal configuration of the origin of the great vessels from the aortic arch.  No evidence hemodynamically significant stenosis involving either carotid bifurcation or either vertebral artery.  IMPRESSION: No evidence of significant stenosis of either carotid bifurcation or either vertebral artery.  Original Report Authenticated By: Fuller Canada, M.D.   Mr Angiogram Neck W Wo Contrast  03/31/2012  *RADIOLOGY REPORT*  Clinical Data:  Left arm numbness, blurred vision and facial droop 2 days ago.  Pain left sided neck.  MRI HEAD WITH AND WITHOUT CONTRAST MRA HEAD WITHOUT CONTRAST MRA NECK WITHOUT AND WITH CONTRAST  Technique: Multiplanar, multiecho pulse sequences of the brain and surrounding structures were obtained according to standard protocol with and without intravenous contrast.  Angiographic images of the Circle of Willis were obtained using MRA technique without intravenous contrast.  Angiographic images of the neck were obtained using MRA technique without and with intravenous contrast.  Contrast:17 ml MultiHance.  Comparison:03/30/2012 head CT.  No comparison brain MR.  MRI HEAD  Findings: No acute infarct.  No intracranial hemorrhage.  No intracranial mass. Either prominent vein or small developmental venous anomaly extends from the posterior right basal ganglia to the right periatrial region.  This is felt to be an incidental finding.  Scattered small number of punctate nonspecific white matter type changes most notable frontal lobes.  This may be related to migraine headaches or small vessel disease.  Other causes include that secondary to vasculitis or inflammatory  process.  With the patient's age and gender, demyelinating process not entirely excluded although the appearance is nonspecific for such.  Left sphenoid sinus opacification with central inspissated material suspected.  IMPRESSION: No acute infarct.  Scattered nonspecific white matter type changes as detailed above.  Opacification left sphenoid sinus air cell with central inspissated material.  MRA HEAD  Findings:Anterior circulation without medium or large size vessel significant stenosis or occlusion.  Ectatic vertebral arteries and basilar artery without significant stenosis.  Nonvisualization left AICA.  Moderate narrowing portions of the superior cerebellar arteries greater on the left.  Branch vessel irregularity.  No aneurysm or vascular malformation noted.  IMPRESSION: Mild branch vessel irregularity.  MRA NECK  Findings:Normal configuration of the origin of the great vessels from the aortic arch.  No evidence hemodynamically significant stenosis involving either carotid bifurcation or either vertebral artery.  IMPRESSION: No evidence of significant stenosis of either carotid bifurcation or either vertebral artery.  Original Report Authenticated By: Fuller Canada, M.D.   Mr Laqueta Jean Wo Contrast  03/31/2012  *RADIOLOGY REPORT*  Clinical Data:  Left arm numbness, blurred vision and facial droop 2 days ago.  Pain left sided neck.  MRI HEAD WITH AND WITHOUT CONTRAST MRA HEAD WITHOUT CONTRAST MRA NECK WITHOUT AND WITH CONTRAST  Technique: Multiplanar, multiecho pulse sequences of the brain and surrounding structures were obtained according to standard protocol with and without intravenous contrast.  Angiographic images of the  Circle of Willis were obtained using MRA technique without intravenous contrast.  Angiographic images of the neck were obtained using MRA technique without and with intravenous contrast.  Contrast:17 ml MultiHance.  Comparison:03/30/2012 head CT.  No comparison brain MR.  MRI HEAD   Findings: No acute infarct.  No intracranial hemorrhage.  No intracranial mass. Either prominent vein or small developmental venous anomaly extends from the posterior right basal ganglia to the right periatrial region.  This is felt to be an incidental finding.  Scattered small number of punctate nonspecific white matter type changes most notable frontal lobes.  This may be related to migraine headaches or small vessel disease.  Other causes include that secondary to vasculitis or inflammatory process.  With the patient's age and gender, demyelinating process not entirely excluded although the appearance is nonspecific for such.  Left sphenoid sinus opacification with central inspissated material suspected.  IMPRESSION: No acute infarct.  Scattered nonspecific white matter type changes as detailed above.  Opacification left sphenoid sinus air cell with central inspissated material.  MRA HEAD  Findings:Anterior circulation without medium or large size vessel significant stenosis or occlusion.  Ectatic vertebral arteries and basilar artery without significant stenosis.  Nonvisualization left AICA.  Moderate narrowing portions of the superior cerebellar arteries greater on the left.  Branch vessel irregularity.  No aneurysm or vascular malformation noted.  IMPRESSION: Mild branch vessel irregularity.  MRA NECK  Findings:Normal configuration of the origin of the great vessels from the aortic arch.  No evidence hemodynamically significant stenosis involving either carotid bifurcation or either vertebral artery.  IMPRESSION: No evidence of significant stenosis of either carotid bifurcation or either vertebral artery.  Original Report Authenticated By: Fuller Canada, M.D.    Medications:  Scheduled:    . 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 EKG normal.  1. Left Arm weakness. Improving but still doesn't have strength in left upper arm. Weakness in left leg is improved. CT was negative for acute bleed. MRI did not show any acute infarct. Did show some punctate non specific white matter changes, most notably in frontal lobes which could be either related to migraine or small vessel disease. Patient does have a history of migraines which cold explain this.  - given no acute findings and persistence of symptoms, will consult neurology to see if there would be any other etiology besides conversion disorder which could be explaining her symptoms. - left neck pain: appears musculoskeletal in nature. Currently on tramadol and tylenol.  - continue PT/OT 2. Chest pain   cardiac enzymes have been negative x3 EKG grossly unchanged from previous this morning - TSH and lipid panel normal - A1C: 6.0. Patient made aware of this especially in setting of family history with high incidence of DM 3. Headache - resolved. Has history of migraines.  4. Recent parathyroid surgery for primary hyperparathyroid: calcium today is 9.7 5. Dispo: pending workup   LOS: 2 days   Zymarion Favorite 04/01/2012, 9:30 AM

## 2012-04-01 NOTE — Progress Notes (Signed)
*  PRELIMINARY RESULTS* Vascular Ultrasound Left upper extremity venous duplex has been completed.  Preliminary findings: Left= No evidence of DVT or SVT.  Farrel Demark, RDMS 04/01/2012, 3:56 PM

## 2012-04-01 NOTE — Progress Notes (Signed)
[facility] CONSULT NOTE     Reason for Consult:  Left arm weakness CC: left sided weakness, posterior headache   HPI:    Terri Montgomery is an 51 y.o. female admitted for left arm weakness since Saturday. At the same time she states that she developed a "aweful pain" at her posterior neck. She describes to me several types of pain. She recently had a parathyroidectomy earlier this year. She describes the pain as beginning at the back of the skull on both sides, radiating down her left neck (not over her surgical incision) over the LICA region and down into chest, just over her left breast. She also complains of a different pain started down her left arm about the same time. She now feels like her left leg is also weak and she has left facial droop.   She tells me that she has been walking in the halls with physicial therapy's assistance using gait belt.  The daughter is in the room with her and provides much of the history. She tells me that her mother just returned last week from a trip to [place] to see her mother. This was stressful upon her and when she returned last week she was anxious and did not want to get out of bed. She does have history of depression. On exam the daughter says that the left facial droop I see today has not been seen by her prior to now.  Patient also has a history of migraine headaches. She does not feel that this headache is her typical migraine. She saw a chiropractor 2 years ago and has not had a migraine since that time. She is unable to take triptans due to chest pain.  Radiology in general reports that the CT revealed no acute findings. The MRI/MRA shows anterior circulation without medium or large size vessel significant stenosis or occlusion. Some ectatic vertebral arteries and basilar artery without significant stenosis. Nonvisualization left AICA. Moderate narrowing portions of the superior cerebellar arteries. Scattered small  number of punctate nonspecific white matter type changes most notable frontal lobes.         Past Medical History  Diagnosis Date  . Hypertrophic pyloric stenosis   . GERD (gastroesophageal reflux disease)   . Anemia   . Arthritis   . Heart murmur   . PONV (postoperative nausea and vomiting)     HA post anesthesia  . Primary hyperparathyroidism 12/19/2011    Underwent removal of a hyperplastic left superior parathyroid March 2013, with improvement in symptoms   . 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 Abdominal pain, RLQ 09/03/2011    Patient with long history of abdominal pain 1999-2006, previously with pain in RLQ, told she would "need surgery" in [place] before moving to [place] in 2008.  Never had this surgery that had been recommended.  S/p ccy, TAH c BSO for fibromas (no cancer history).     Past Surgical History  Procedure Date  . Abdominal hysterectomy 2002  . Cholecystectomy 1998  . Parathyroidectomy 01/21/2012    Procedure: PARATHYROIDECTOMY;  Surgeon: Currie Paris, MD;  Location: [facility] OR;  Service: General;  Laterality: N/A;  Left superior parathyroidectomy    Family History  Problem Relation Age of Onset  . Heart disease Daughter   . Heart disease Maternal Grandmother   .  Heart disease Paternal Grandmother   . Anesthesia problems Neg Hx     Social History:  reports that she has never smoked. She does not have any smokeless tobacco history on file. She reports that she does not drink alcohol or use illicit drugs.  Allergies  Allergen Reactions  . Demerol Shortness Of Breath  . Iodine Shortness Of Breath  . Penicillins Shortness Of Breath  . Imitrex (Sumatriptan Base) Other (See Comments)    Increases HA Chest Pain  . Morphine And Related Nausea And Vomiting    Medications:    Prior to Admission:  Prescriptions prior to admission  Medication Sig Dispense Refill  . ibuprofen (ADVIL,MOTRIN) 800 MG  tablet Take 800 mg by mouth every 8 (eight) hours as needed. As needed for pain.      . Probiotic Product (PROBIOTIC PO) Take 1 capsule by mouth daily.       Scheduled:   . sodium chloride  3 mL Intravenous Q12H    Review of Systems - General ROS: negative for - chills, fatigue, fever or hot flashes Hematological and Lymphatic ROS: negative for - bruising, jaundice or pallor, + fatigue Endocrine ROS: negative for - hair pattern changes, hot flashes, mood swings or skin changes Respiratory ROS: negative for - cough, hemoptysis, orthopnea or wheezing Cardiovascular ROS: negative for - dyspnea on exertion, orthopnea, palpitations or shortness of breath Gastrointestinal ROS: negative for - abdominal pain, appetite loss, blood in stools, diarrhea or hematemesis Musculoskeletal ROS: negative for - joint pain, joint stiffness, joint swelling or muscle pain. + left arm pain she states from neck to elbow region with movement Neurological ROS: positive for - behavioral changes, headaches, numbness/tingling and weakness Dermatological ROS: negative for dry skin, pruritus and rash   Blood pressure 112/75, pulse 59, temperature 97.7 F (36.5 C), temperature source Oral, resp. rate 18, height 5\' 2"  (1.575 m), weight 65.885 kg (145 lb 4 oz), SpO2 97.00%.   Neurologic Examination:   Mental Status: Alert, oriented, thought content appropriate. Flat affect. Speech fluent without evidence of aphasia. Able to follow 3 step commands without difficulty. Cranial Nerves: II-Visual fields grossly intact. III/IV/VI-Extraocular movements intact.  Pupils reactive bilaterally. V/VII-Smile asymmetric, flattened left nasolabial fold VIII-grossly intact IX/X-normal gag XI-bilateral shoulder shrug XII-midline tongue extension Motor: weakness of left arm and leg. She is able to lift left leg for 5 seconds for hip flexion, but strength of 1/5 as compared to 5/5 of right arm,leg. She does have pain with passive ROM of  left arm 90 degrees at shoulder. Her pain complaints are at the left [facility] joint and into the left brachial plexus. Guarding. Sensory: Pinprick and light touch decreased from left face, left arm, left leg. She also describes numbness of the right leg middle toe. Deep Tendon Reflexes: Decreased throughout Plantars downgoing bilaterally Cerebellar: Normal finger-to-nose, normal rapid alternating movements and slowing heel-to-shin test (left).   Gait: Unsteady requiring assistance "weak" per patient and nonspecific findings on exam.   Lab Results  Component Value Date/Time   CHOL 161 03/31/2012  5:40 AM    Results for orders placed during the hospital encounter of 03/30/12 (from the past 48 hour(s))  CARDIAC PANEL(CRET KIN+CKTOT+MB+TROPI)     Status: Normal   Collection Time   03/30/12  5:09 PM      Component Value Range Comment   Total CK 70  7 - 177 (U/L)    CK, MB 1.8  0.3 - 4.0 (ng/mL)    Troponin I <0.30  <  0.30 (ng/mL)    Relative Index RELATIVE INDEX IS INVALID  0.0 - 2.5    BASIC METABOLIC PANEL     Status: Normal   Collection Time   03/30/12  6:29 PM      Component Value Range Comment   Sodium 142  135 - 145 (mEq/L)    Potassium 3.7  3.5 - 5.1 (mEq/L)    Chloride 106  96 - 112 (mEq/L)    CO2 26  19 - 32 (mEq/L)    Glucose, Bld 95  70 - 99 (mg/dL)    BUN 10  6 - 23 (mg/dL)    Creatinine, Ser 1.61  0.50 - 1.10 (mg/dL)    Calcium 09.6  8.4 - 10.5 (mg/dL)    GFR calc non Af Amer >90  >90 (mL/min)    GFR calc Af Amer >90  >90 (mL/min)   CBC     Status: Normal   Collection Time   03/30/12  6:29 PM      Component Value Range Comment   WBC 8.0  4.0 - 10.5 (K/uL)    RBC 4.41  3.87 - 5.11 (MIL/uL)    Hemoglobin 13.2  12.0 - 15.0 (g/dL)    HCT 04.5  40.9 - 81.1 (%)    MCV 86.2  78.0 - 100.0 (fL)    MCH 29.9  26.0 - 34.0 (pg)    MCHC 34.7  30.0 - 36.0 (g/dL)    RDW 91.4  78.2 - 95.6 (%)    Platelets 336  150 - 400 (K/uL)   DIFFERENTIAL     Status: Normal   Collection Time    03/30/12  6:29 PM      Component Value Range Comment   Neutrophils Relative 50  43 - 77 (%)    Neutro Abs 4.0  1.7 - 7.7 (K/uL)    Lymphocytes Relative 43  12 - 46 (%)    Lymphs Abs 3.5  0.7 - 4.0 (K/uL)    Monocytes Relative 5  3 - 12 (%)    Monocytes Absolute 0.4  0.1 - 1.0 (K/uL)    Eosinophils Relative 1  0 - 5 (%)    Eosinophils Absolute 0.1  0.0 - 0.7 (K/uL)    Basophils Relative 0  0 - 1 (%)    Basophils Absolute 0.0  0.0 - 0.1 (K/uL)   CARDIAC PANEL(CRET KIN+CKTOT+MB+TROPI)     Status: Normal   Collection Time   03/30/12 10:32 PM      Component Value Range Comment   Total CK 62  7 - 177 (U/L)    CK, MB 1.6  0.3 - 4.0 (ng/mL)    Troponin I <0.30  <0.30 (ng/mL)    Relative Index RELATIVE INDEX IS INVALID  0.0 - 2.5    CARDIAC PANEL(CRET KIN+CKTOT+MB+TROPI)     Status: Normal   Collection Time   03/31/12  5:30 AM      Component Value Range Comment   Total CK 52  7 - 177 (U/L)    CK, MB 1.5  0.3 - 4.0 (ng/mL)    Troponin I <0.30  <0.30 (ng/mL)    Relative Index RELATIVE INDEX IS INVALID  0.0 - 2.5    HEMOGLOBIN A1C     Status: Abnormal   Collection Time   03/31/12  5:40 AM      Component Value Range Comment   Hemoglobin A1C 6.0 (*) <5.7 (%)    Mean Plasma Glucose 126 (*) <117 (mg/dL)  BASIC METABOLIC PANEL     Status: Normal   Collection Time   03/31/12  5:40 AM      Component Value Range Comment   Sodium 141  135 - 145 (mEq/L)    Potassium 3.7  3.5 - 5.1 (mEq/L)    Chloride 104  96 - 112 (mEq/L)    CO2 26  19 - 32 (mEq/L)    Glucose, Bld 96  70 - 99 (mg/dL)    BUN 14  6 - 23 (mg/dL)    Creatinine, Ser 1.61  0.50 - 1.10 (mg/dL)    Calcium 09.6  8.4 - 10.5 (mg/dL)    GFR calc non Af Amer >90  >90 (mL/min)    GFR calc Af Amer >90  >90 (mL/min)   CBC     Status: Normal   Collection Time   03/31/12  5:40 AM      Component Value Range Comment   WBC 8.5  4.0 - 10.5 (K/uL)    RBC 4.33  3.87 - 5.11 (MIL/uL)    Hemoglobin 12.9  12.0 - 15.0 (g/dL)    HCT 04.5  40.9 - 81.1  (%)    MCV 87.5  78.0 - 100.0 (fL)    MCH 29.8  26.0 - 34.0 (pg)    MCHC 34.0  30.0 - 36.0 (g/dL)    RDW 91.4  78.2 - 95.6 (%)    Platelets 335  150 - 400 (K/uL)   LIPID PANEL     Status: Normal   Collection Time   03/31/12  5:40 AM      Component Value Range Comment   Cholesterol 161  0 - 200 (mg/dL)    Triglycerides 213  <150 (mg/dL)    HDL 59  >08 (mg/dL)    Total CHOL/HDL Ratio 2.7      VLDL 21  0 - 40 (mg/dL)    LDL Cholesterol 81  0 - 99 (mg/dL)   TSH     Status: Normal   Collection Time   03/31/12  9:55 AM      Component Value Range Comment   TSH 1.623  0.350 - 4.500 (uIU/mL)   PHOSPHORUS     Status: Normal   Collection Time   03/31/12 12:38 PM      Component Value Range Comment   Phosphorus 3.6  2.3 - 4.6 (mg/dL)   MAGNESIUM     Status: Normal   Collection Time   03/31/12 12:38 PM      Component Value Range Comment   Magnesium 2.1  1.5 - 2.5 (mg/dL)   BASIC METABOLIC PANEL     Status: Normal   Collection Time   04/01/12  6:25 AM      Component Value Range Comment   Sodium 139  135 - 145 (mEq/L)    Potassium 3.6  3.5 - 5.1 (mEq/L)    Chloride 104  96 - 112 (mEq/L)    CO2 26  19 - 32 (mEq/L)    Glucose, Bld 97  70 - 99 (mg/dL)    BUN 13  6 - 23 (mg/dL)    Creatinine, Ser 6.57  0.50 - 1.10 (mg/dL)    Calcium 9.7  8.4 - 10.5 (mg/dL)    GFR calc non Af Amer >90  >90 (mL/min)    GFR calc Af Amer >90  >90 (mL/min)   CBC     Status: Normal   Collection Time   04/01/12  6:25 AM  Component Value Range Comment   WBC 9.8  4.0 - 10.5 (K/uL)    RBC 4.23  3.87 - 5.11 (MIL/uL)    Hemoglobin 12.7  12.0 - 15.0 (g/dL)    HCT 45.4  09.8 - 11.9 (%)    MCV 86.8  78.0 - 100.0 (fL)    MCH 30.0  26.0 - 34.0 (pg)    MCHC 34.6  30.0 - 36.0 (g/dL)    RDW 14.7  82.9 - 56.2 (%)    Platelets 309  150 - 400 (K/uL)     Ct Head Wo Contrast  03/30/2012  *RADIOLOGY REPORT*  Clinical Data: Left arm weakness, left facial droop, blurred vision and headache.  CT HEAD WITHOUT CONTRAST   Technique:  Contiguous axial images were obtained from the base of the skull through the vertex without contrast.  Comparison: None.  Findings: The brain demonstrates no evidence of hemorrhage, infarction, edema, mass effect, extra-axial fluid collection, hydrocephalus or mass lesion.  The skull is unremarkable.  IMPRESSION: Normal head CT.  Original Report Authenticated By: Reola Calkins, M.D.   Mr Maxine Glenn Head Wo Contrast  03/31/2012  *RADIOLOGY REPORT*  Clinical Data:  Left arm numbness, blurred vision and facial droop 2 days ago.  Pain left sided neck.  MRI HEAD WITH AND WITHOUT CONTRAST MRA HEAD WITHOUT CONTRAST MRA NECK WITHOUT AND WITH CONTRAST  Technique: Multiplanar, multiecho pulse sequences of the brain and surrounding structures were obtained according to standard protocol with and without intravenous contrast.  Angiographic images of the Circle of Willis were obtained using MRA technique without intravenous contrast.  Angiographic images of the neck were obtained using MRA technique without and with intravenous contrast.  Contrast:17 ml MultiHance.  Comparison:03/30/2012 head CT.  No comparison brain MR.  MRI HEAD  Findings: No acute infarct.  No intracranial hemorrhage.  No intracranial mass. Either prominent vein or small developmental venous anomaly extends from the posterior right basal ganglia to the right periatrial region.  This is felt to be an incidental finding.  Scattered small number of punctate nonspecific white matter type changes most notable frontal lobes.  This may be related to migraine headaches or small vessel disease.  Other causes include that secondary to vasculitis or inflammatory process.  With the patient's age and gender, demyelinating process not entirely excluded although the appearance is nonspecific for such.  Left sphenoid sinus opacification with central inspissated material suspected.  IMPRESSION: No acute infarct.  Scattered nonspecific white matter type changes as  detailed above.  Opacification left sphenoid sinus air cell with central inspissated material.  MRA HEAD  Findings:Anterior circulation without medium or large size vessel significant stenosis or occlusion.  Ectatic vertebral arteries and basilar artery without significant stenosis.  Nonvisualization left AICA.  Moderate narrowing portions of the superior cerebellar arteries greater on the left.  Branch vessel irregularity.  No aneurysm or vascular malformation noted.  IMPRESSION: Mild branch vessel irregularity.  MRA NECK  Findings:Normal configuration of the origin of the great vessels from the aortic arch.  No evidence hemodynamically significant stenosis involving either carotid bifurcation or either vertebral artery.  IMPRESSION: No evidence of significant stenosis of either carotid bifurcation or either vertebral artery.  Original Report Authenticated By: Fuller Canada, M.D.   Mr Angiogram Neck W Wo Contrast  03/31/2012  *RADIOLOGY REPORT*  Clinical Data:  Left arm numbness, blurred vision and facial droop 2 days ago.  Pain left sided neck.  MRI HEAD WITH AND WITHOUT CONTRAST MRA HEAD WITHOUT  CONTRAST MRA NECK WITHOUT AND WITH CONTRAST  Technique: Multiplanar, multiecho pulse sequences of the brain and surrounding structures were obtained according to standard protocol with and without intravenous contrast.  Angiographic images of the Circle of Willis were obtained using MRA technique without intravenous contrast.  Angiographic images of the neck were obtained using MRA technique without and with intravenous contrast.  Contrast:17 ml MultiHance.  Comparison:03/30/2012 head CT.  No comparison brain MR.  MRI HEAD  Findings: No acute infarct.  No intracranial hemorrhage.  No intracranial mass. Either prominent vein or small developmental venous anomaly extends from the posterior right basal ganglia to the right periatrial region.  This is felt to be an incidental finding.  Scattered small number of punctate  nonspecific white matter type changes most notable frontal lobes.  This may be related to migraine headaches or small vessel disease.  Other causes include that secondary to vasculitis or inflammatory process.  With the patient's age and gender, demyelinating process not entirely excluded although the appearance is nonspecific for such.  Left sphenoid sinus opacification with central inspissated material suspected.  IMPRESSION: No acute infarct.  Scattered nonspecific white matter type changes as detailed above.  Opacification left sphenoid sinus air cell with central inspissated material.  MRA HEAD  Findings:Anterior circulation without medium or large size vessel significant stenosis or occlusion.  Ectatic vertebral arteries and basilar artery without significant stenosis.  Nonvisualization left AICA.  Moderate narrowing portions of the superior cerebellar arteries greater on the left.  Branch vessel irregularity.  No aneurysm or vascular malformation noted.  IMPRESSION: Mild branch vessel irregularity.  MRA NECK  Findings:Normal configuration of the origin of the great vessels from the aortic arch.  No evidence hemodynamically significant stenosis involving either carotid bifurcation or either vertebral artery.  IMPRESSION: No evidence of significant stenosis of either carotid bifurcation or either vertebral artery.  Original Report Authenticated By: Fuller Canada, M.D.   Mr Laqueta Jean Wo Contrast  03/31/2012  *RADIOLOGY REPORT*  Clinical Data:  Left arm numbness, blurred vision and facial droop 2 days ago.  Pain left sided neck.  MRI HEAD WITH AND WITHOUT CONTRAST MRA HEAD WITHOUT CONTRAST MRA NECK WITHOUT AND WITH CONTRAST  Technique: Multiplanar, multiecho pulse sequences of the brain and surrounding structures were obtained according to standard protocol with and without intravenous contrast.  Angiographic images of the Circle of Willis were obtained using MRA technique without intravenous contrast.   Angiographic images of the neck were obtained using MRA technique without and with intravenous contrast.  Contrast:17 ml MultiHance.  Comparison:03/30/2012 head CT.  No comparison brain MR.  MRI HEAD  Findings: No acute infarct.  No intracranial hemorrhage.  No intracranial mass. Either prominent vein or small developmental venous anomaly extends from the posterior right basal ganglia to the right periatrial region.  This is felt to be an incidental finding.  Scattered small number of punctate nonspecific white matter type changes most notable frontal lobes.  This may be related to migraine headaches or small vessel disease.  Other causes include that secondary to vasculitis or inflammatory process.  With the patient's age and gender, demyelinating process not entirely excluded although the appearance is nonspecific for such.  Left sphenoid sinus opacification with central inspissated material suspected.  IMPRESSION: No acute infarct.  Scattered nonspecific white matter type changes as detailed above.  Opacification left sphenoid sinus air cell with central inspissated material.  MRA HEAD  Findings:Anterior circulation without medium or large size vessel significant stenosis or  occlusion.  Ectatic vertebral arteries and basilar artery without significant stenosis.  Nonvisualization left AICA.  Moderate narrowing portions of the superior cerebellar arteries greater on the left.  Branch vessel irregularity.  No aneurysm or vascular malformation noted.  IMPRESSION: Mild branch vessel irregularity.  MRA NECK  Findings:Normal configuration of the origin of the great vessels from the aortic arch.  No evidence hemodynamically significant stenosis involving either carotid bifurcation or either vertebral artery.  IMPRESSION: No evidence of significant stenosis of either carotid bifurcation or either vertebral artery.  Original Report Authenticated By: Fuller Canada, M.D.     Assessment/Plan:   1) Left extremity  weakness, with left facial numbness 2) Headache with history of migraines, no chiropractic visits in 2 years. Unable to take triptans due to chest pain. 3) Left arm pain with passive ROM. 4) Recent intensive anxiety per family and patient.  Rec: Although stressors remain in the differential would like to rule out other possible etiologies such as a cervical etiology.  No evidence currently of intracerebral lesion on dissection on imaging.   1) MRI of the cervical spine 2) If above unremarkable would consider orthopedic input.   3) ESR  Will review case with Dr. Thad Ranger.   Gwendolyn Lima. Manson Passey, [facility] [phone]  04/01/2012, 11:40 AM   Patient seen and examined. I agree with the above.  Thana Farr, MD [facility] [phone]  04/01/2012  8:20 PM

## 2012-04-02 ENCOUNTER — Encounter (HOSPITAL_COMMUNITY): Payer: Self-pay | Admitting: General Practice

## 2012-04-02 DIAGNOSIS — M79609 Pain in unspecified limb: Secondary | ICD-10-CM

## 2012-04-02 DIAGNOSIS — G8320 Monoplegia of upper limb affecting unspecified side: Secondary | ICD-10-CM

## 2012-04-02 MED ORDER — TRAMADOL HCL 50 MG PO TABS
50.0000 mg | ORAL_TABLET | ORAL | Status: AC
  Administered 2012-04-02: 50 mg via ORAL
  Filled 2012-04-02: qty 1

## 2012-04-02 MED ORDER — GABAPENTIN 100 MG PO CAPS
100.0000 mg | ORAL_CAPSULE | Freq: Three times a day (TID) | ORAL | Status: DC
  Administered 2012-04-02 – 2012-04-03 (×3): 100 mg via ORAL
  Filled 2012-04-02 (×4): qty 1

## 2012-04-02 MED ORDER — MORPHINE SULFATE 2 MG/ML IJ SOLN
2.0000 mg | INTRAMUSCULAR | Status: DC | PRN
  Administered 2012-04-02 (×2): 2 mg via INTRAVENOUS
  Filled 2012-04-02 (×2): qty 1

## 2012-04-02 MED ORDER — METHYLPREDNISOLONE SODIUM SUCC 125 MG IJ SOLR
125.0000 mg | Freq: Once | INTRAMUSCULAR | Status: AC
  Administered 2012-04-02: 125 mg via INTRAVENOUS
  Filled 2012-04-02: qty 2

## 2012-04-02 NOTE — Care Management Note (Unsigned)
    Page 1 of 2   04/03/2012     3:17:26 PM   CARE MANAGEMENT NOTE 04/03/2012  Patient:  Terri Montgomery, Terri Montgomery   Account Number:  [id number]  Date Initiated:  03/31/2012  Documentation initiated by:  [facility]  Subjective/Objective Assessment:   ADmitted with left aarm weakness, facial droop.Lives with spouse.     Action/Plan:   PT eval-recommends HHPT  OT eval-outpatient OT  ST eval-no d/c needs   Anticipated DC Date:  04/03/2012   Anticipated DC Plan:  HOME W HOME HEALTH SERVICES      DC Planning Services  CM consult      Choice offered to / List presented to:  C-1 Patient        HH arranged  HH-2 PT      [facility] agency  [facility]   Status of service:  In process, will continue to follow Medicare Important Message given?   (If response is "NO", the following Medicare IM given date fields will be blank) Date Medicare IM given:   Date Additional Medicare IM given:    Discharge Disposition:    Per UR Regulation:  Reviewed for med. necessity/level of care/duration of stay  If discussed at Long Length of Stay Meetings, dates discussed:    Comments:  04/03/12 Spoke with patient, she has decided that she would like to have HHPT.She chose [facility] from the [facility] agencies list. Dava Najjar at [facility] and requested HHPT. Jacquelynn Cree RN, BSN, CCM  04/02/12 Spoke with patient and her daughter [name] about [facility] for HHPT. Patient decided that she does not want HHPT, will do exercises provided by OT. Will continue to follow for d/c needs.Jacquelynn Cree RN, BSN, CCM

## 2012-04-02 NOTE — Progress Notes (Addendum)
Family Medicine Teaching Service Attending Note  I discussed patient Terri Montgomery  with Dr. Clinton Sawyer and reviewed their note for today.  I agree with their assessment and plan.     Appreciate neurology evaluation.  Anticipate that they can give [place] a differential diagnosis and if any further evaluation needed other than EMGs. Plan to discharge soon with close follow up and Physical Therapy Likely start gabapentin for pain control if ok with neurology

## 2012-04-02 NOTE — Progress Notes (Signed)
Physical Therapy Treatment Patient Details Name: Terri Montgomery MRN: [id number] DOB: 11/14/1960 Today's Date: 04/02/2012 Time: 8295-6213 PT Time Calculation (min): 11 min  PT Assessment / Plan / Recommendation Comments on Treatment Session  Patient agreed to practiced steps again prior to DC. Spoke with patients son about need for assistance with steps and assistance with ambulation. Patient limited by  pain in arm and wanting to return to bed    Follow Up Recommendations  Home health PT;Supervision/Assistance - 24 hour    Barriers to Discharge        Equipment Recommendations  None recommended by PT    Recommendations for Other Services    Frequency Min 3X/week   Plan Discharge plan remains appropriate    Precautions / Restrictions Precautions Precautions: Fall   Pertinent Vitals/Pain 3/10 L arm    Mobility  Bed Mobility Rolling Left: 6: Modified independent (Device/Increase time) Left Sidelying to Sit: 6: Modified independent (Device/Increase time) Sit to Supine: 6: Modified independent (Device/Increase time) Transfers Sit to Stand: 5: Supervision Stand to Sit: 5: Supervision Details for Transfer Assistance: supervision for safety Ambulation/Gait Ambulation/Gait Assistance: 4: Min guard Ambulation Distance (Feet): 225 Feet Assistive device: None Ambulation/Gait Assistance Details: Pt with increased steadiness today. Less lean towards R side Gait Pattern: Decreased stride length Gait velocity: decreased Stairs Assistance: 4: Min assist Stairs Assistance Details (indicate cue type and reason): Min A for balance descending steps Stair Management Technique: One rail Right;Step to pattern;Forwards Number of Stairs: 4     Exercises     PT Diagnosis:    PT Problem List:   PT Treatment Interventions:     PT Goals Acute Rehab PT Goals PT Goal: Supine/Side to Sit - Progress: Met PT Goal: Sit to Supine/Side - Progress: Met PT Goal: Sit to Stand - Progress: Progressing  toward goal PT Goal: Stand to Sit - Progress: Progressing toward goal PT Goal: Ambulate - Progress: Progressing toward goal PT Goal: Up/Down Stairs - Progress: Progressing toward goal  Visit Information  Last PT Received On: 04/02/12    Subjective Data      Cognition  Overall Cognitive Status: Appears within functional limits for tasks assessed/performed Arousal/Alertness: Awake/alert Orientation Level: Appears intact for tasks assessed Behavior During Session: [facility], [facility], [facility] for tasks performed    Balance     End of Session PT - End of Session Activity Tolerance: Patient limited by fatigue;Patient limited by pain Patient left: in bed;with call bell/phone within reach;with family/visitor present Nurse Communication: Mobility status    Fredrich Birks 04/02/2012, 3:04 PM 04/02/2012 Fredrich Birks PTA [phone] pager [phone] office

## 2012-04-02 NOTE — Progress Notes (Signed)
[facility] PROGRESS NOTE    SUBJECTIVE    Patient seen today and complains of left shoulder pain with pain distally to elbow over brachial plexus. Using ice to help with pain. Due to left sided weakness, MRI C-spine was completed and did not show any significant encroagement into the cord. No sign of tumor/mass.   OBJECTIVE   Vital signs in last 24 hours: Temp:  [97.4 F (36.3 C)-98.4 F (36.9 C)] 98.1 F (36.7 C) (05/23 0605) Pulse Rate:  [56-70] 59  (05/23 0605) Resp:  [16-19] 19  (05/23 0605) BP: (103-112)/(56-78) 106/56 mmHg (05/23 0605) SpO2:  [98 %-99 %] 99 % (05/23 0605)  Intake/Output from previous day: 05/22 0701 - 05/23 0700 In: 603 [P.O.:600; I.V.:3] Out: -  Intake/Output this shift:   Nutritional status: General  Past Medical History  Diagnosis Date  . Hypertrophic pyloric stenosis   . GERD (gastroesophageal reflux disease)   . Anemia   . Arthritis   . Heart murmur   . PONV (postoperative nausea and vomiting)     HA post anesthesia  . Primary hyperparathyroidism 12/19/2011    Underwent removal of a hyperplastic left superior parathyroid March 2013, with improvement in symptoms   . 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 Abdominal pain, RLQ 09/03/2011    Patient with long history of abdominal pain 1999-2006, previously with pain in RLQ, told she would "need surgery" in [place] before moving to [place] in 2008.  Never had this surgery that had been recommended.  S/p ccy, TAH c BSO for fibromas (no cancer history).   . Headache     Neurologic Exam:   Mental Status: Alert, oriented, thought content appropriate.  Speech fluent without evidence of aphasia. Able to follow 3 step commands without difficulty. Cranial Nerves: II-Visual fields grossly intact. III/IV/VI-Extraocular movements intact.  Pupils reactive bilaterally. V/VII-Smile symmetric, no signs  of facial droop today. VIII-grossly intact IX/X-normal gag XI-bilateral shoulder shrug XII-midline tongue extension Motor: decreased strength in LLE, LUE, right normal tone and bulk, she does guard movement of PROM left shouler.  Sensory: Pinprick and light touch intact throughout, bilaterally Deep Tendon Reflexes: 2+ and symmetric throughout Plantars downgoing bilaterally Cerebellar: Normal finger-to-nose, normal rapid alternating movements and normal heel-to-shin test.    Lab Results: Lab Results  Component Value Date/Time   CHOL 161 03/31/2012  5:40 AM   Lipid Panel  Basename 03/31/12 0540  CHOL 161  TRIG 104  HDL 59  CHOLHDL 2.7  VLDL 21  LDLCALC 81    Studies/Results: Mr [facility] Contrast  03/31/2012  *RADIOLOGY REPORT*  Clinical Data:  Left arm numbness, blurred vision and facial droop 2 days ago.  Pain left sided neck.  MRI HEAD WITH AND WITHOUT CONTRAST MRA HEAD WITHOUT CONTRAST MRA NECK WITHOUT AND WITH CONTRAST  Technique: Multiplanar, multiecho pulse sequences of the brain and surrounding structures were obtained according to standard protocol with and without intravenous contrast.  Angiographic images of the Circle of Willis were obtained using MRA technique without intravenous contrast.  Angiographic images of the neck were obtained using MRA technique without and with intravenous contrast.  Contrast:17 ml MultiHance.  Comparison:03/30/2012 head CT.  No comparison brain MR.  MRI  HEAD  Findings: No acute infarct.  No intracranial hemorrhage.  No intracranial mass. Either prominent vein or small developmental venous anomaly extends from the posterior right basal ganglia to the right periatrial region.  This is felt to be an incidental finding.  Scattered small number of punctate nonspecific white matter type changes most notable frontal lobes.  This may be related to migraine headaches or small vessel disease.  Other causes include that secondary to vasculitis or inflammatory  process.  With the patient's age and gender, demyelinating process not entirely excluded although the appearance is nonspecific for such.  Left sphenoid sinus opacification with central inspissated material suspected.  IMPRESSION: No acute infarct.  Scattered nonspecific white matter type changes as detailed above.  Opacification left sphenoid sinus air cell with central inspissated material.  MRA HEAD  Findings:Anterior circulation without medium or large size vessel significant stenosis or occlusion.  Ectatic vertebral arteries and basilar artery without significant stenosis.  Nonvisualization left AICA.  Moderate narrowing portions of the superior cerebellar arteries greater on the left.  Branch vessel irregularity.  No aneurysm or vascular malformation noted.  IMPRESSION: Mild branch vessel irregularity.  MRA NECK  Findings:Normal configuration of the origin of the great vessels from the aortic arch.  No evidence hemodynamically significant stenosis involving either carotid bifurcation or either vertebral artery.  IMPRESSION: No evidence of significant stenosis of either carotid bifurcation or either vertebral artery.  Original Report Authenticated By: Fuller Canada, M.D.   Mr Angiogram Neck W Wo Contrast  03/31/2012  *RADIOLOGY REPORT*  Clinical Data:  Left arm numbness, blurred vision and facial droop 2 days ago.  Pain left sided neck.  MRI HEAD WITH AND WITHOUT CONTRAST MRA HEAD WITHOUT CONTRAST MRA NECK WITHOUT AND WITH CONTRAST  Technique: Multiplanar, multiecho pulse sequences of the brain and surrounding structures were obtained according to standard protocol with and without intravenous contrast.  Angiographic images of the Circle of Willis were obtained using MRA technique without intravenous contrast.  Angiographic images of the neck were obtained using MRA technique without and with intravenous contrast.  Contrast:17 ml MultiHance.  Comparison:03/30/2012 head CT.  No comparison brain MR.  MRI  HEAD  Findings: No acute infarct.  No intracranial hemorrhage.  No intracranial mass. Either prominent vein or small developmental venous anomaly extends from the posterior right basal ganglia to the right periatrial region.  This is felt to be an incidental finding.  Scattered small number of punctate nonspecific white matter type changes most notable frontal lobes.  This may be related to migraine headaches or small vessel disease.  Other causes include that secondary to vasculitis or inflammatory process.  With the patient's age and gender, demyelinating process not entirely excluded although the appearance is nonspecific for such.  Left sphenoid sinus opacification with central inspissated material suspected.  IMPRESSION: No acute infarct.  Scattered nonspecific white matter type changes as detailed above.  Opacification left sphenoid sinus air cell with central inspissated material.  MRA HEAD  Findings:Anterior circulation without medium or large size vessel significant stenosis or occlusion.  Ectatic vertebral arteries and basilar artery without significant stenosis.  Nonvisualization left AICA.  Moderate narrowing portions of the superior cerebellar arteries greater on the left.  Branch vessel irregularity.  No aneurysm or vascular malformation noted.  IMPRESSION: Mild branch vessel irregularity.  MRA NECK  Findings:Normal configuration of the origin of the great vessels from the aortic arch.  No evidence hemodynamically significant stenosis involving either carotid bifurcation or either vertebral artery.  IMPRESSION: No evidence of significant stenosis of either carotid bifurcation or either vertebral artery.  Original Report Authenticated By: Fuller Canada, M.D.   Mr [facility] Wo Contrast  03/31/2012  *RADIOLOGY REPORT*  Clinical Data:  Left arm numbness, blurred vision and facial droop 2 days ago.  Pain left sided neck.  MRI HEAD WITH AND WITHOUT CONTRAST MRA HEAD WITHOUT CONTRAST MRA NECK WITHOUT AND  WITH CONTRAST  Technique: Multiplanar, multiecho pulse sequences of the brain and surrounding structures were obtained according to standard protocol with and without intravenous contrast.  Angiographic images of the Circle of Willis were obtained using MRA technique without intravenous contrast.  Angiographic images of the neck were obtained using MRA technique without and with intravenous contrast.  Contrast:17 ml MultiHance.  Comparison:03/30/2012 head CT.  No comparison brain MR.  MRI HEAD  Findings: No acute infarct.  No intracranial hemorrhage.  No intracranial mass. Either prominent vein or small developmental venous anomaly extends from the posterior right basal ganglia to the right periatrial region.  This is felt to be an incidental finding.  Scattered small number of punctate nonspecific white matter type changes most notable frontal lobes.  This may be related to migraine headaches or small vessel disease.  Other causes include that secondary to vasculitis or inflammatory process.  With the patient's age and gender, demyelinating process not entirely excluded although the appearance is nonspecific for such.  Left sphenoid sinus opacification with central inspissated material suspected.  IMPRESSION: No acute infarct.  Scattered nonspecific white matter type changes as detailed above.  Opacification left sphenoid sinus air cell with central inspissated material.  MRA HEAD  Findings:Anterior circulation without medium or large size vessel significant stenosis or occlusion.  Ectatic vertebral arteries and basilar artery without significant stenosis.  Nonvisualization left AICA.  Moderate narrowing portions of the superior cerebellar arteries greater on the left.  Branch vessel irregularity.  No aneurysm or vascular malformation noted.  IMPRESSION: Mild branch vessel irregularity.  MRA NECK  Findings:Normal configuration of the origin of the great vessels from the aortic arch.  No evidence hemodynamically  significant stenosis involving either carotid bifurcation or either vertebral artery.  IMPRESSION: No evidence of significant stenosis of either carotid bifurcation or either vertebral artery.  Original Report Authenticated By: Fuller Canada, M.D.   Mr Cervical Spine Wo Contrast  04/02/2012  *RADIOLOGY REPORT*  Clinical Data: Neck pain radiating down left arm.  MRI CERVICAL SPINE WITHOUT CONTRAST  Technique:  Multiplanar and multiecho pulse sequences of the cervical spine, to include the craniocervical junction and cervicothoracic junction, were obtained according to standard protocol without intravenous contrast.  Comparison: MR brain and MRA angiogram 03/31/2012  Findings: The vertebral arteries are patent bilaterally without findings of dissection.  The proximal left vertebral artery is slightly prominent size but without definitive findings of fibromuscular dysplasia on recent MR angiogram.  Left lobe of thyroid 4.4 mm nonspecific lesion.  Sphenoid sinus opacification.  Complex material contained within the opacified left sphenoid sinus air cell.  This may represent inspissated material.  Fungal disease not entirely excluded in the proper clinical setting.  Cervical medullary junction unremarkable.  Artifact partially extends through the cervical cord.  No focal cervical cord signal abnormality noted.  Facet joint degenerative changes most prominent on the right at the C2-3 level and on the left at the C3-4 level.  C2-3:  Right facet joint degenerative changes.  Minimal right foraminal narrowing.  C3-4:  Left facet joint degenerative changes.  Minimal  left foraminal narrowing.  C4-5:  Minimal bulge.  Minimal left foraminal narrowing.  C5-6:  Bulge/shallow protrusion slightly greater to the left. Minimal cord contact and deformity.  Minimal foraminal narrowing greater on the left.  C6-7:  Minimal bulge.  Minimal left foraminal narrowing.  C7-T1:  Very mild facet joint degenerative changes.  T1-2:  Minimal  bulge.  IMPRESSION: C5-6 bulge/shallow protrusion slightly greater to the left. Minimal cord contact and deformity.  Minimal foraminal narrowing greater on the left.  Facet joint degenerative changes most prominent on the right at the C2-3 level and on the left at the C3-4 level.  The vertebral arteries are patent bilaterally without findings of dissection.  The proximal left vertebral artery is slightly prominent in size but without definitive findings of fibromuscular dysplasia on recent MR angiogram.  Sphenoid sinus opacification.  Complex material contained within the opacified left sphenoid sinus air cell.  This may represent inspissated material.  Fungal disease not entirely excluded in the proper clinical setting.  Left lobe of thyroid 4.4 mm nonspecific lesion.  Original Report Authenticated By: Fuller Canada, M.D.    Medications:     Scheduled:   . sodium chloride  3 mL Intravenous Q12H    Assessment/Plan:    1) Left extremity weakness: C-Spine no acute findings.. Rec: EMG/NCS as outpatient. In addition, PT/OT home evaluation if deemed necessary with therapy as inpatient. 2) Intermittent left facial droop, MRI/MRI no acute findings on head scans.  2) Left shoulder pain, recommend ortho consult either here or as outpatient 3) ESR currently pending. Can be followed up as outpatient.  Guy Franco PA-C [facility] Pager [phone]   04/02/2012, 9:15 AM

## 2012-04-02 NOTE — Progress Notes (Signed)
Family Medicine Teaching Service Interim Progress Note  S: Stopped in evaluate patient. Still have severe, gnawing L lateral neck pain that radiates to the lateral arm. Pain improved with massaging the neck. Pain subjectively worse with movement of shoulder. No improvement with morphine, flexeril or tramadol.   O:  BP 129/89  Pulse 78  Temp(Src) 98.2 F (36.8 C) (Oral)  Resp 18  Ht 5\' 2"  (1.575 m)  Wt 145 lb 4 oz (65.885 kg)  BMI 26.57 kg/m2  SpO2 100% General appearance: alert, cooperative and moderately obese L shoulder: unable due do full examine due to pain. No erythema, fluctuance or tenderness to palpation.   A/P 51 yo F who presented with L arm paresis concerning for CVA. Has had a negative CVA work-up and now has acute onset of L arm pain without trauma.   Pain concerning for neuropathy given radicular nature. Neuro onboard and recommend gabapentin (ordered). Will give IV solumedrol 125 mg x 1 dose and f/u response. If pain is not significantly improved with neurontin will consider imaging with MRI. Abscess is very unlikely given lack of infectious signs.   Dessa Phi, MD 04/02/12; 6:54 PM [facility] Resident, PGY2 Pager [phone]

## 2012-04-02 NOTE — Progress Notes (Signed)
Brief Progress Note  S: I was speaking to the patient's daughter, Terri Montgomery, who happens to be out LCSW, and she told me that the patient was having a sudden onset excruciating pain in her left arm. In observing the patient, she states that the pain is predominantly in the left shoulder and lateral upper arm.   O: BP 115/73  Pulse 74  Temp(Src) 98 F (36.7 C) (Oral)  Resp 18  Ht 5\' 2"  (1.575 m)  Wt 145 lb 4 oz (65.885 kg)  BMI 26.57 kg/m2  SpO2 99% Gen: middle-aged hispanic female, crying, in acute pain LUE: patient guarding arm close to body; will not voluntarily move it based upon pain; severe pain with passive flexion and abduction; tenderness to palpation along posterior AC joint  A/P: 51 y.o. F with sudden onset left arm pain in the setting of work-up for LUE weakness and pain of undetermined origin. DDX includes peripheral neuropathy vs complex regional pain syndrome vs adhesive capsilits  - Pain control - continue tramadol 100 mg and Flexeril 5 mg; give morphine 2 mg preceded by zofran; also start gabapentin 300 mg TID tonight - Consider other imaging, but not sure utility at this point, would appreciate a better PE when patient with reduced pain    Terri Mitchell, MD

## 2012-04-02 NOTE — Progress Notes (Signed)
Daily Progress Note Terri Montgomery. Clinton Sawyer, M.D., M.B.A  Family Medicine PGY-1 Pager [phone]  Subjective:  Pain - still in left shoulder, breast and upper arm Weakness - minimally improved in left arm from baseline    Objective: Vital signs in last 24 hours: Temp:  [97.4 F (36.3 C)-98.4 F (36.9 C)] 98.1 F (36.7 C) (05/23 0605) Pulse Rate:  [56-70] 59  (05/23 0605) Resp:  [16-19] 19  (05/23 0605) BP: (103-112)/(56-78) 106/56 mmHg (05/23 0605) SpO2:  [98 %-99 %] 99 % (05/23 0605) Weight change:  Last BM Date: 03/31/12  Intake/Output from previous day: 05/22 0701 - 05/23 0700 In: 603 [P.O.:600; I.V.:3] Out: -  Intake/Output this shift:    General: alert and oriented, fatigued  HEENT: PERRLA, EOMI, MMM CV: S1S2, RRR, no murmurs Pulm: CTA B/L  GI: present bowel sounds, no tenderness Neuro: 2/5 strength in left hand, able to move her left forearm 2/5 strength in forearm, 0/5 left deltoid, 5/5 strength in right arm and hand. 4+/5 strength in left leg and 5/5 strength in right leg, sensation to light touch intact in all extremities, CN2-12 grossly intact. Brachioradialis reflexes difficult to illicit bilaterally MSK: no tenderness in chest wall. Tenderness to palpation along left arm.   Lab Results:  Basename 04/01/12 0625 03/31/12 0540  WBC 9.8 8.5  HGB 12.7 12.9  HCT 36.7 37.9  PLT 309 335   BMET  Basename 04/01/12 0625 03/31/12 0540  NA 139 141  K 3.6 3.7  CL 104 104  CO2 26 26  GLUCOSE 97 96  BUN 13 14  CREATININE 0.63 0.70  CALCIUM 9.7 10.1    Studies/Results: MRI Cervical Spine:  C5-6 bulge/shallow protrusion slightly greater to the left.  Minimal cord contact and deformity. Minimal foraminal narrowing  greater on the left.  Facet joint degenerative changes most prominent on the right at the  C2-3 level and on the left at the C3-4 level.  The vertebral arteries are patent bilaterally without findings of  dissection. The proximal left vertebral artery  is slightly  prominent in size but without definitive findings of fibromuscular  dysplasia on recent MR angiogram.  Sphenoid sinus opacification. Complex material contained within  the opacified left sphenoid sinus air cell. This may represent  inspissated material. Fungal disease not entirely excluded in the  proper clinical setting.  Left lobe of thyroid 4.4 mm nonspecific lesion.  LUE Doppler: negative for DVT   Medications:  Scheduled:    . sodium chloride  3 mL Intravenous Q12H  . traMADol  50 mg Oral NOW   AVW:UJWJXB chloride, acetaminophen, acetaminophen, cyclobenzaprine, ondansetron (ZOFRAN) IV, ondansetron, senna-docusate, sodium chloride, traMADol  Assessment/Plan: 51 y/o hispanic female with left arm paresis with possible CVA also complaining of headache, and chest pain. EKG normal.   1. Left Arm weakness. Unknown origin, MRI/MRA of head and neck all normal, differential include peripheral nerve injury vs conversion disorder - No further inpatient work-up needed at this time; thanks to neurology for the insight - likely would benefit from orthopedic vs neurosurgery outpatient consultation  2. Chest pain   cardiac enzymes have been negative x3 EKG grossly unchanged from previous this morning - TSH and lipid panel normal - A1C: 6.0. Patient made aware of this especially in setting of family history with high incidence of DM  3. Headache - resolved. Has history of migraines.   4. Recent parathyroid surgery for primary hyperparathyroid: calcium today is 9.7  5. Dispo: possibly 5/23   LOS: 3  days   Mat Carne 04/02/2012, 9:55 AM

## 2012-04-02 NOTE — Progress Notes (Signed)
Occupational Therapy Treatment Patient Details Name: Terri Montgomery MRN: [id number] DOB: 1960/12/06 Today's Date: 04/02/2012 Time: 4098-1191 OT Time Calculation (min): 35 min  OT Assessment / Plan / Recommendation Comments on Treatment Session Pt. continues with limited Lt. UE ROM and function - appears to be related to severity of pain, but difficult to accurately determine.  Pt. does not allow for ROM passively or active assisted.  Pt. did perform small ranges closed chain, table top ROM.  HEP provided.      Follow Up Recommendations  Outpatient OT;Supervision - Intermittent    Barriers to Discharge       Equipment Recommendations  None recommended by PT;None recommended by OT    Recommendations for Other Services    Frequency Min 2X/week   Plan Discharge plan needs to be updated    Precautions / Restrictions Precautions Precautions: Fall Restrictions Weight Bearing Restrictions: No       ADL  ADL Comments: Pt. supine with complaint of pain 10/10.   Pt. performed table top, closed chain shoulder flexion and abduction and adduction in small ranges (~10-15*).  Pt. requires mod encouragement to perform 10 reps each.  Pt. was instructed in a HEP table top ROM, self ROM, isometric shoulder flexion and abduction and theraputty exercises.  Pt. provided with theraputty.  Pt. attempted to sit up in chair, but unable to tolerate due to pain.  Pt. returned to supine.  Ice applied    OT Diagnosis:    OT Problem List:   OT Treatment Interventions:     OT Goals ADL Goals ADL Goal: Additional Goal #1 - Progress: Progressing toward goals ADL Goal: Additional Goal #2 - Progress: Not progressing  Visit Information  Last OT Received On: 04/02/12 Assistance Needed: +1    Subjective Data      Prior Functioning       Cognition  Overall Cognitive Status: Appears within functional limits for tasks assessed/performed Arousal/Alertness: Awake/alert Orientation Level: Appears intact for  tasks assessed Behavior During Session: [facility] for tasks performed    Mobility Bed Mobility Rolling Left: 6: Modified independent (Device/Increase time) Left Sidelying to Sit: 6: Modified independent (Device/Increase time) Supine to Sit: 6: Modified independent (Device/Increase time) Sit to Supine: 6: Modified independent (Device/Increase time) Transfers Transfers: Sit to Stand;Stand to Sit Sit to Stand: 6: Modified independent (Device/Increase time) Stand to Sit: 5: Supervision Transfer via Lift Equipment: Maximove Details for Transfer Assistance: supervision for safety   Exercises    Balance    End of Session OT - End of Session Activity Tolerance: Patient limited by pain Patient left: in bed;with call bell/phone within reach;with family/visitor present   Enslee Bibbins M 04/02/2012, 3:36 PM

## 2012-04-03 MED ORDER — TRAMADOL HCL 50 MG PO TABS: 50.0000 mg | ORAL_TABLET | Freq: Four times a day (QID) | ORAL | Status: DC | PRN

## 2012-04-03 MED ORDER — GABAPENTIN 100 MG PO CAPS: 100.0000 mg | ORAL_CAPSULE | Freq: Three times a day (TID) | ORAL | Status: DC

## 2012-04-03 MED ORDER — CYCLOBENZAPRINE HCL 5 MG PO TABS: 5.0000 mg | ORAL_TABLET | Freq: Two times a day (BID) | ORAL | Status: DC | PRN

## 2012-04-03 MED ORDER — SENNA 8.6 MG PO TABS: 2.0000 | ORAL_TABLET | Freq: Every day | ORAL | Status: DC

## 2012-04-03 MED ORDER — SENNA 8.6 MG PO TABS
2.0000 | ORAL_TABLET | Freq: Every day | ORAL | Status: DC
  Administered 2012-04-03: 17.2 mg via ORAL
  Filled 2012-04-03: qty 2

## 2012-04-03 NOTE — Discharge Summary (Signed)
Physician Discharge Summary   Patient ID: Terri Montgomery [id number] 50 y.o. 1961-09-24  Admit date: 03/30/2012  Discharge date and time: 04/03/2012  4:33 PM   Admitting Physician: Carney Living, MD   Discharge Physician: Carney Living, MD   Admission Diagnoses:  Left sided weakness  Discharge Diagnoses:  Left arm weakness Chest pain-resolved Recent parathyroidectomy Migraine headaches  Admission Condition: fair  Discharged Condition: good  Indication for Admission: left sided weakness and chest pain  Hospital Course:  51 yo female who presented with 2 day history of left arm paresis and pain, as well as chest pain and headache.   # Left arm paresis: On initial presentation, patient was unable to move left arm and hand. Sensation remained intact with brachioradialis reflexes symmetric. She also had intermittent left leg weakness with intact sensation and reflexes bilaterally. Head CT was done which did not show any acute bleed. A brain MRI and neck MRA did not show any evidence of acute infarct. C-spine MRI did not show any acute findings. Neurology was consulted and recommended outpatient EMG/NCS and orthopedic consult. Patient continued having pain along her left humerus and was started on flexeril, tramadol and IV morphine with some pain relief. She slowly regained movement in forearm and hand.  On the day prior to discharge, she had acute episode of pain in her left arm and was given solumedrol 125mg  x1 in case there was an inflammatory component in her left shoulder. She was also started on neurontin. The next day, pain had completely resolved and patient was able to flex her elbow but still had limited shoulder abduction. The etiology of her weakness and pain remained unclear. She admitted to recent high stress making conversion disorder a possibility. However, nerve plexus injury vs fibromyalgia vs adhesive capsulitis were part of the differential. Since patient had  regained some mobility of the left arm and had pain adequately controled, further workup was planned as an outpatient.   # Chest pain: patient reported 2 day history of persistent left sided chest pressure, not associated with shortness of breath. EKG did not show any ST elevation concerning for MI. Cardiac enzymes were negative x3. Risk stratification labs were obtained and TSH and lipid panel were normal. A1C was 6.0 and patient was made aware of this especially with a strong family history of diabetes. Patient's chest pain resolved over the course of the admission and seemed to be more directly related to her arm pain. She denied any chest pain on discharge.   # Headache: patient reported history of migraine headaches. Head CT and MRI did not show any acute findings. Headache resolved during admission.  # History of parathyroidectomy secondary to primary hypothyroid: given recent surgery in march 2013, Calcium was checked and was within normal range.   Consults:  Neurology PT/OT  Significant Diagnostic Studies:   CT HEAD WITHOUT CONTRAST 03/30/12 Technique: Contiguous axial images were obtained from the base of  the skull through the vertex without contrast.  Comparison: None.  Findings: The brain demonstrates no evidence of hemorrhage,  infarction, edema, mass effect, extra-axial fluid collection,  hydrocephalus or mass lesion. The skull is unremarkable.  IMPRESSION:  Normal head CT.  MRI CERVICAL SPINE WITHOUT CONTRAST 04/01/12 Findings: The vertebral arteries are patent bilaterally without findings of dissection. The proximal left vertebral artery is  slightly prominent size but without definitive findings of fibromuscular dysplasia on recent MR angiogram.  Left lobe of thyroid 4.4 mm nonspecific lesion. Sphenoid sinus opacification. Complex material  contained within  the opacified left sphenoid sinus air cell. This may represent inspissated material. Fungal disease not entirely  excluded in the proper clinical setting. Cervical medullary junction unremarkable. Artifact partially extends through the cervical cord. No focal cervical cord signal abnormality noted. Facet joint degenerative changes most prominent on the right at the C2-3 level and on the left at the C3-4 level. C2-3: Right facet joint degenerative changes. Minimal right foraminal narrowing. C3-4: Left facet joint degenerative changes. Minimal left foraminal narrowing. C4-5: Minimal bulge. Minimal left foraminal narrowing. C5-6: Bulge/shallow protrusion slightly greater to the left. Minimal cord contact and deformity. Minimal foraminal narrowing greater on the left. C6-7: Minimal bulge. Minimal left foraminal narrowing. C7-T1: Very mild facet joint degenerative changes. T1-2: Minimal bulge.  IMPRESSION:  C5-6 bulge/shallow protrusion slightly greater to the left.  Minimal cord contact and deformity. Minimal foraminal narrowing greater on the left. Facet joint degenerative changes most prominent on the right at the C2-3 level and on the left at the C3-4 level. The vertebral arteries are patent bilaterally without findings of dissection. The proximal left vertebral artery is slightly prominent in size but without definitive findings of fibromuscular dysplasia on recent MR angiogram. Sphenoid sinus opacification. Complex material contained within the opacified left sphenoid sinus air cell. This may represent inspissated material. Fungal disease not entirely excluded in the proper clinical setting. Left lobe of thyroid 4.4 mm nonspecific lesion.  MRI HEAD WITH AND WITHOUT CONTRAST 03/31/12 MRA HEAD WITHOUT CONTRAST  MRA NECK WITHOUT AND WITH CONTRAST  MRI HEAD  Findings: No acute infarct. No intracranial hemorrhage.  No intracranial mass. Either prominent vein or small developmental  venous anomaly extends from the posterior right basal ganglia to  the right periatrial region. This is felt to be an incidental  finding.    Scattered small number of punctate nonspecific white matter type  changes most notable frontal lobes. This may be related to  migraine headaches or small vessel disease. Other causes include  that secondary to vasculitis or inflammatory process. With the  patient's age and gender, demyelinating process not entirely  excluded although the appearance is nonspecific for such.  Left sphenoid sinus opacification with central inspissated material  suspected.  IMPRESSION:  No acute infarct.  Scattered nonspecific white matter type changes as detailed above.  Opacification left sphenoid sinus air cell with central inspissated  material.  MRA HEAD  Findings:Anterior circulation without medium or large size vessel  significant stenosis or occlusion.  Ectatic vertebral arteries and basilar artery without significant  stenosis.  Nonvisualization left AICA.  Moderate narrowing portions of the superior cerebellar arteries  greater on the left.  Branch vessel irregularity.  No aneurysm or vascular malformation noted.  IMPRESSION:  Mild branch vessel irregularity.  MRA NECK  Findings:Normal configuration of the origin of the great vessels  from the aortic arch.  No evidence hemodynamically significant stenosis involving either  carotid bifurcation or either vertebral artery.  IMPRESSION:  No evidence of significant stenosis of either carotid bifurcation  or either vertebral artery.  CBC    Component Value Date/Time   WBC 9.8 04/01/2012 0625   RBC 4.23 04/01/2012 0625   HGB 12.7 04/01/2012 0625   HCT 36.7 04/01/2012 0625   PLT 309 04/01/2012 0625   MCV 86.8 04/01/2012 0625   MCH 30.0 04/01/2012 0625   MCHC 34.6 04/01/2012 0625   RDW 12.8 04/01/2012 0625   LYMPHSABS 3.5 03/30/2012 1829   MONOABS 0.4 03/30/2012 1829   EOSABS 0.1 03/30/2012  1829   BASOSABS 0.0 03/30/2012 1829    CMP     Component Value Date/Time   NA 139 04/01/2012 0625   K 3.6 04/01/2012 0625   CL 104 04/01/2012 0625   CO2  26 04/01/2012 0625   GLUCOSE 97 04/01/2012 0625   BUN 13 04/01/2012 0625   CREATININE 0.63 04/01/2012 0625   CREATININE 0.69 12/02/2011 1600   CALCIUM 9.7 04/01/2012 0625   CALCIUM 11.2* 12/02/2011 1600   PROT 6.7 12/16/2011 1932   ALBUMIN 4.0 12/16/2011 1932   AST 20 12/16/2011 1932   ALT 14 12/16/2011 1932   ALKPHOS 136* 12/16/2011 1932   BILITOT 0.3 12/16/2011 1932   GFRNONAA >90 04/01/2012 0625   GFRAA >90 04/01/2012 0625    Lipid Panel     Component Value Date/Time   CHOL 161 03/31/2012 0540   TRIG 104 03/31/2012 0540   HDL 59 03/31/2012 0540   CHOLHDL 2.7 03/31/2012 0540   VLDL 21 03/31/2012 0540   LDLCALC 81 03/31/2012 0540   TSH: 1.623 A1C: 6.0  Treatments:  IV morphine IV solumedrol  Discharge Exam: Filed Vitals:   04/03/12 0600 04/03/12 0825 04/03/12 0923 04/03/12 1350  BP: 96/67 98/66 93/64  110/71  Pulse: 67 83 82 79  Temp: 97.3 F (36.3 C) 97.9 F (36.6 C) 97.7 F (36.5 C) 97.4 F (36.3 C)  TempSrc:  Oral Oral Oral  Resp: 18 20 18 18   Height:      Weight:      SpO2: 94% 95% 95% 93%   General: alert and oriented, sitting in chair, smiling and comfortable appearing HEENT: PERRLA, EOMI, MMM  CV: S1S2, RRR, no murmurs  Pulm: CTA B/L  GI: present bowel sounds, no tenderness  Neuro: 4/5 strength in left hand, able to move her left forearm, 3/5 strength in forearm, 0/5 left deltoid, 4+/5 strength in right arm and hand. 4+/5 strength in left leg and 5/5 strength in right leg, sensation to light touch intact in all extremities, CN2-12 grossly intact. Brachioradialis reflexes symmetric  MSK: no tenderness in chest wall. No tenderness along left shoulder or arm  Disposition: 01-Home or Self Care with home health PT/OT  Patient Instructions:  Medication List  As of 04/03/2012  9:05 PM   TAKE these medications         cyclobenzaprine 5 MG tablet   Commonly known as: FLEXERIL   Take 1 tablet (5 mg total) by mouth 3 times/day as needed-between meals & bedtime for muscle spasms.       gabapentin 100 MG capsule   Commonly known as: NEURONTIN   Take 1 capsule (100 mg total) by mouth 3 (three) times daily.      ibuprofen 800 MG tablet   Commonly known as: ADVIL,MOTRIN   Take 800 mg by mouth every 8 (eight) hours as needed. As needed for pain.      PROBIOTIC PO   Take 1 capsule by mouth daily.      senna 8.6 MG Tabs   Commonly known as: SENOKOT   Take 2 tablets (17.2 mg total) by mouth daily.      traMADol 50 MG tablet   Commonly known as: ULTRAM   Take 1 tablet (50 mg total) by mouth every 6 (six) hours as needed.           Activity: activity as tolerated Diet: regular diet Wound Care: none needed  Follow-up Information    Follow up with Barbaraann Barthel, MD. (Friday 31st of May  at 10:45am. )    Contact information:   [address] [place] [address] [phone]       Follow up with [facility]. (Follow up with Dr. Ave Filter, who is a shoulder specialist on June 5th at 1:30PM. He will then refer you to Dr.Wang in the same practice for nerve conduction studies.  )    Contact information:   [address] [facility] [place] [address]        Follow up items: - A1C at 6.0 with strong family history for diabetes, putting patient at higher risk for diabetes - pain control with neurontin and tramadol.  - nerve conduction studies - evaluation of left shoulder by orthopedist   Signed: Marena Chancy 04/03/2012 9:05 PM

## 2012-04-03 NOTE — Progress Notes (Signed)
Patient Terri Montgomery, 51 year old Hispanic female enjoys the emotional support of her husband, 2 children, and one grandchild.  She smiles and appears upbeat.  Patient expressed appreciation for Chaplain's provision of pastoral presence and conversation.  I will follow-up as needed.

## 2012-04-03 NOTE — Discharge Instructions (Signed)
You were in the hospital for pain and weakness in your arm. There was no evidence of bleeding on the head CT scan and no evidence of stroke on the MRI. The MRI of your spine did not show any compression on your spine that could be explaining your symptoms.  Neurology recommends that you see an orthopedic doctor to evaluate your shoulder. They also recommended that you get nerve conduction studies. This can be done at [facility]. You have an appointment with Dr. Ave Filter for your shoulder. When you see him, ask him to refer you to Dr. Regino Schultze for the nerve conduction studies.   For pain control, we started you on tramadol that you can take every 6-8 hours as needed for pain. I am also sending a prescription for flexeril (one daily as needed) and neurontin (three times daily on a scheduled basis). If you have any acute, non manageable pain, call the clinic.

## 2012-04-04 NOTE — Discharge Summary (Signed)
I have reviewed this discharge summary and agree.    

## 2012-04-06 NOTE — Progress Notes (Signed)
Patient seen and examined. I agree with the above.  Thana Farr, MD [facility] [phone]  04/06/2012  5:41 PM

## 2012-04-10 ENCOUNTER — Ambulatory Visit: Payer: Commercial Managed Care - PPO | Admitting: Family Medicine

## 2012-04-13 ENCOUNTER — Telehealth (INDEPENDENT_AMBULATORY_CARE_PROVIDER_SITE_OTHER): Payer: Self-pay

## 2012-04-13 NOTE — Telephone Encounter (Signed)
The patient called and cancelled her appointment for tomorrow due to her insurance changing.  She wants to know what her calcium was when she was admitted to the hospital.  I thought her visit may have been covered still under the global but I wasn't sure.  I thought she should keep her appointment.

## 2012-04-13 NOTE — Telephone Encounter (Signed)
Patient made aware that she is still in her global period. Appt re-made for tomorrow.

## 2012-04-14 ENCOUNTER — Encounter (INDEPENDENT_AMBULATORY_CARE_PROVIDER_SITE_OTHER): Payer: Self-pay | Admitting: Surgery

## 2012-04-14 ENCOUNTER — Encounter (INDEPENDENT_AMBULATORY_CARE_PROVIDER_SITE_OTHER): Payer: Commercial Managed Care - PPO | Admitting: Surgery

## 2012-04-14 ENCOUNTER — Ambulatory Visit (INDEPENDENT_AMBULATORY_CARE_PROVIDER_SITE_OTHER): Payer: Commercial Managed Care - PPO | Admitting: Surgery

## 2012-04-14 VITALS — BP 102/80 | HR 73 | Temp 98.3°F | Resp 14 | Ht 62.0 in | Wt 144.0 lb

## 2012-04-14 DIAGNOSIS — Z9889 Other specified postprocedural states: Secondary | ICD-10-CM

## 2012-04-14 NOTE — Progress Notes (Signed)
NAME: Terri Montgomery                                            DOB: 03-05-1961 DATE: 04/14/2012                                                  MRN: [id number]  CC: Post op   HPI: This patient comes in for post op follow-up.Sheunderwent paraathyroidectomy on 01/22/12. She feels that she is has not improved from her pre-op symptoms - she is still fatigued, poor appetite etc. She recently had an admission for facial weakness, but no stroke. Her TSH and Ca++ were normal during that visit  PE:  VITAL SIGNS: BP 102/80  Pulse 73  Temp(Src) 98.3 F (36.8 C) (Temporal)  Resp 14  Ht 5\' 2"  (1.575 m)  Wt 144 lb (65.318 kg)  BMI 26.34 kg/m2  General: The patient appears to be healthy, NAD Wound healed  DATA REVIEWED: NOtes from recent hospital stay  IMPRESSION: The patient is doing well S/P parathyroidectomy, but unfortunately no improvement in symptoms, even though calcium level has stabilized.    Plan: RTC prn - discussed need to f/u with primary care about ongoing sx.

## 2012-04-14 NOTE — Patient Instructions (Signed)
Your calcium and thyroid function levels were normal when you were in the hospital recently. You should discuss your ongoing issues with fatigue, poor appetite etc with your primary care physician.  We will see you again on an as needed basis. Please call the office at [phone] if you have any questions or concerns. Thank you for allowing [place] to take care of you.

## 2012-06-18 ENCOUNTER — Emergency Department (HOSPITAL_COMMUNITY)
Admission: EM | Admit: 2012-06-18 | Discharge: 2012-06-18 | Disposition: A | Discharge status: Nursing Home (Includes ICF) | Payer: Commercial Managed Care - PPO | Source: Home / Self Care | Attending: Family Medicine | Admitting: Family Medicine

## 2012-06-18 ENCOUNTER — Encounter (HOSPITAL_COMMUNITY): Payer: Self-pay | Admitting: Adult Health

## 2012-06-18 ENCOUNTER — Encounter (HOSPITAL_COMMUNITY): Payer: Self-pay | Admitting: *Deleted

## 2012-06-18 ENCOUNTER — Emergency Department (HOSPITAL_COMMUNITY)
Admission: EM | Admit: 2012-06-18 | Discharge: 2012-06-18 | Disposition: A | Discharge status: Home / Self Care | Payer: Commercial Managed Care - PPO | Attending: Emergency Medicine | Admitting: Emergency Medicine

## 2012-06-18 DIAGNOSIS — M5481 Occipital neuralgia: Secondary | ICD-10-CM

## 2012-06-18 DIAGNOSIS — K219 Gastro-esophageal reflux disease without esophagitis: Secondary | ICD-10-CM | POA: Insufficient documentation

## 2012-06-18 DIAGNOSIS — M129 Arthropathy, unspecified: Secondary | ICD-10-CM | POA: Insufficient documentation

## 2012-06-18 DIAGNOSIS — D649 Anemia, unspecified: Secondary | ICD-10-CM | POA: Insufficient documentation

## 2012-06-18 DIAGNOSIS — R51 Headache: Secondary | ICD-10-CM | POA: Insufficient documentation

## 2012-06-18 DIAGNOSIS — M531 Cervicobrachial syndrome: Secondary | ICD-10-CM

## 2012-06-18 MED ORDER — METOCLOPRAMIDE HCL 5 MG/ML IJ SOLN
10.0000 mg | Freq: Once | INTRAMUSCULAR | Status: AC
  Administered 2012-06-18: 10 mg via INTRAVENOUS
  Filled 2012-06-18: qty 2

## 2012-06-18 MED ORDER — HYDROCODONE-ACETAMINOPHEN 5-325 MG PO TABS
1.0000 | ORAL_TABLET | Freq: Once | ORAL | Status: AC
  Administered 2012-06-18: 1 via ORAL

## 2012-06-18 MED ORDER — DIPHENHYDRAMINE HCL 50 MG/ML IJ SOLN
25.0000 mg | Freq: Once | INTRAMUSCULAR | Status: AC
  Administered 2012-06-18: 25 mg via INTRAVENOUS
  Filled 2012-06-18: qty 1

## 2012-06-18 MED ORDER — HYDROCODONE-ACETAMINOPHEN 5-325 MG PO TABS
ORAL_TABLET | ORAL | Status: AC
  Filled 2012-06-18: qty 1

## 2012-06-18 NOTE — ED Provider Notes (Signed)
History     CSN: [id number]  Arrival date & time 06/18/12  1620   First MD Initiated Contact with Patient 06/18/12 1624      Chief Complaint  Patient presents with  . Migraine    (Consider location/radiation/quality/duration/timing/severity/associated sxs/prior treatment) Patient is a 51 y.o. female presenting with migraine. The history is provided by the patient and a relative.  Migraine This is a recurrent problem. The current episode started more than 1 week ago (h/o hosp 03/2012 for similar sx, ). The problem has been gradually worsening. Associated symptoms include headaches.    Past Medical History  Diagnosis Date  . Hypertrophic pyloric stenosis   . GERD (gastroesophageal reflux disease)   . Anemia   . Arthritis   . Heart murmur   . PONV (postoperative nausea and vomiting)     HA post anesthesia  . Primary hyperparathyroidism 12/19/2011    Underwent removal of a hyperplastic left superior parathyroid March 2013, with improvement in symptoms   . 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 Abdominal pain, RLQ 09/03/2011    Patient with long history of abdominal pain 1999-2006, previously with pain in RLQ, told she would "need surgery" in [place] before moving to [place] in 2008.  Never had this surgery that had been recommended.  S/p ccy, TAH c BSO for fibromas (no cancer history).   . Headache     Past Surgical History  Procedure Date  . Abdominal hysterectomy 2002  . Cholecystectomy 1998  . Parathyroidectomy 01/21/2012    Procedure: PARATHYROIDECTOMY;  Surgeon: Currie Paris, MD;  Location: [facility] OR;  Service: General;  Laterality: N/A;  Left superior parathyroidectomy    Family History  Problem Relation Age of Onset  . Heart disease Daughter   . Heart disease Maternal Grandmother   . Heart disease Paternal Grandmother   . Anesthesia problems Neg Hx     History  Substance Use Topics  . Smoking status: Never  Smoker   . Smokeless tobacco: Never Used  . Alcohol Use: No    OB History    Grav Para Term Preterm Abortions TAB SAB Ect Mult Living                  Review of Systems  Constitutional: Positive for chills and diaphoresis.  HENT: Positive for neck pain and neck stiffness. Negative for congestion, rhinorrhea and postnasal drip.   Respiratory: Negative.   Cardiovascular: Negative.   Gastrointestinal: Negative.   Neurological: Positive for dizziness and headaches. Negative for seizures, speech difficulty, weakness, light-headedness and numbness.    Allergies  Demerol; Iodine; Penicillins; Imitrex; and Morphine and related  Home Medications  No current outpatient prescriptions on file.  BP 131/87  Pulse 72  Temp 98.5 F (36.9 C) (Oral)  Resp 16  SpO2 100%  Physical Exam  Nursing note and vitals reviewed. Constitutional: She is oriented to person, place, and time. She appears well-developed and well-nourished. She appears distressed.  HENT:  Head: Normocephalic.  Right Ear: External ear normal.  Left Ear: External ear normal.  Mouth/Throat: Oropharynx is clear and moist.  Eyes: Conjunctivae are normal. Pupils are equal, round, and reactive to light.  Neck: Trachea normal. Muscular tenderness present. Carotid bruit is not present. Decreased range of motion present. No erythema present. No Brudzinski's sign and no Kernig's sign noted. No mass and no thyromegaly present.  Cardiovascular: Normal rate.   Pulmonary/Chest: Effort normal and breath  sounds normal.  Abdominal: Soft. Bowel sounds are normal. There is no tenderness.  Neurological: She is alert and oriented to person, place, and time. No cranial nerve deficit. Coordination normal.  Skin: Skin is warm and dry.    ED Course  Procedures (including critical care time)  Labs Reviewed - No data to display No results found.   1. Cervico-occipital neuralgia       MDM         Linna Hoff, MD 06/18/12  [phone]

## 2012-06-18 NOTE — ED Provider Notes (Signed)
History     CSN: [id number]  Arrival date & time 06/18/12  1743   First MD Initiated Contact with Patient 06/18/12 2019      Chief Complaint  Patient presents with  . Migraine    (Consider location/radiation/quality/duration/timing/severity/associated sxs/prior treatment) HPI Comments: Patient presents with one-week history of front total and occipital headache described as throbbing with radiation to her face. Onset was gradual, there is no thunderclap. The patient denies head injury. She denies fever. She has had some sinus pressure during the week. She's tried over-the-counter medications without relief. Light and loud sounds makes the pain worse. She has had several episodes of nausea and vomiting some headache. Patient was admitted for weakness in her extremities several months ago and had negative CT scans and MRI performed. Course is constant. Patient states that she has blurry vision in her right eye.  Patient is a 51 y.o. female presenting with headaches. The history is provided by the patient and the spouse.  Headache  This is a new problem. The current episode started more than 2 days ago. The problem occurs constantly. The problem has not changed since onset.The headache is associated with bright light and loud noise. The pain is located in the occipital and frontal region. The quality of the pain is described as throbbing. The pain is moderate. The pain radiates to the face. Associated symptoms include nausea and vomiting. Pertinent negatives include no fever and no shortness of breath. She has tried acetaminophen and resting in a darkened room for the symptoms. The treatment provided no relief.    Past Medical History  Diagnosis Date  . Hypertrophic pyloric stenosis   . GERD (gastroesophageal reflux disease)   . Anemia   . Arthritis   . Heart murmur   . PONV (postoperative nausea and vomiting)     HA post anesthesia  . Primary hyperparathyroidism 12/19/2011    Underwent  removal of a hyperplastic left superior parathyroid March 2013, with improvement in symptoms   . 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 Abdominal pain, RLQ 09/03/2011    Patient with long history of abdominal pain 1999-2006, previously with pain in RLQ, told she would "need surgery" in [place] before moving to [place] in 2008.  Never had this surgery that had been recommended.  S/p ccy, TAH c BSO for fibromas (no cancer history).   . Headache     Past Surgical History  Procedure Date  . Abdominal hysterectomy 2002  . Cholecystectomy 1998  . Parathyroidectomy 01/21/2012    Procedure: PARATHYROIDECTOMY;  Surgeon: Currie Paris, MD;  Location: [facility] OR;  Service: General;  Laterality: N/A;  Left superior parathyroidectomy    Family History  Problem Relation Age of Onset  . Heart disease Daughter   . Heart disease Maternal Grandmother   . Heart disease Paternal Grandmother   . Anesthesia problems Neg Hx     History  Substance Use Topics  . Smoking status: Never Smoker   . Smokeless tobacco: Never Used  . Alcohol Use: No    OB History    Grav Para Term Preterm Abortions TAB SAB Ect Mult Living                  Review of Systems  Constitutional: Negative for fever.  HENT: Positive for congestion, neck pain and sinus pressure. Negative for rhinorrhea, neck stiffness and dental problem.   Eyes: Positive for photophobia and visual  disturbance. Negative for discharge and redness.  Respiratory: Negative for shortness of breath.   Cardiovascular: Negative for chest pain.  Gastrointestinal: Positive for nausea and vomiting.  Musculoskeletal: Negative for gait problem.  Skin: Negative for rash.  Neurological: Positive for headaches. Negative for syncope, speech difficulty, weakness, light-headedness and numbness.  Psychiatric/Behavioral: Negative for confusion.    Allergies  Demerol; Iodine; Penicillins; Imitrex; and Morphine  and related  Home Medications   Current Outpatient Rx  Name Route Sig Dispense Refill  . PSEUDOEPHEDRINE HCL ER 120 MG PO TB12 Oral Take 120 mg by mouth every 12 (twelve) hours.      BP 131/82  Pulse 60  Temp 98.5 F (36.9 C) (Oral)  Resp 18  SpO2 100%  Physical Exam  Nursing note and vitals reviewed. Constitutional: She is oriented to person, place, and time. She appears well-developed and well-nourished.       Patient sitting in dark room wearing sunglasses.   HENT:  Head: Normocephalic and atraumatic.  Right Ear: External ear normal.  Left Ear: External ear normal.  Mouth/Throat: Oropharynx is clear and moist.  Eyes: Conjunctivae are normal. Pupils are equal, round, and reactive to light. Right eye exhibits no discharge. Left eye exhibits no discharge.  Neck: Normal range of motion. Neck supple.       No meningeal signs  Cardiovascular: Normal rate, regular rhythm and normal heart sounds.   Pulmonary/Chest: Effort normal and breath sounds normal. No respiratory distress.  Abdominal: Soft. Bowel sounds are normal. She exhibits no distension. There is no tenderness.  Musculoskeletal: She exhibits no edema and no tenderness.  Lymphadenopathy:    She has no cervical adenopathy.  Neurological: She is alert and oriented to person, place, and time. She has normal strength. No cranial nerve deficit or sensory deficit. She displays a negative Romberg sign. GCS eye subscore is 4. GCS verbal subscore is 5. GCS motor subscore is 6.  Skin: Skin is warm and dry.  Psychiatric: She has a normal mood and affect.    ED Course  Procedures (including critical care time)  Labs Reviewed - No data to display No results found.   1. Headache     9:39 PM Patient seen and examined. Medications ordered. Previous admission records reviewed.   Vital signs reviewed and are as follows: Filed Vitals:   06/18/12 2053  BP: 131/82  Pulse: 60  Temp: 98.5 F (36.9 C)  Resp: 18   Discussed  with Dr. Manus Gunning who has seen. Patient with resolution of HA after medications. She is requesting discharge to home.    MDM  Patients with migraine headache. She does not have meningeal signs, fever, or any concerning findings for meningitis. Patient had previous recent workup for weakness that included an MRI and CT which were normal. Do not think that the patient is having a stroke. She does not have any focal neurological findings today. The symptoms are improved with treatment in emergency department. She appears well at time of discharge.        Renne Crigler, [place] 06/18/12 [phone]

## 2012-06-18 NOTE — ED Notes (Signed)
Patient C/O having a migraine headache for 1 week.  States that the headache has been worsening. Pain is in the front of her head and behind her left ear.  C/O nausea, vomiting and photophobia. C/O numbness in the left side of her face also.

## 2012-06-18 NOTE — ED Notes (Addendum)
C/o migraine that began at 1130,  that is worse than typical migraine for her, pain is located on left occipital area of head and left frontal lobe radiates to left ear and left neck. Pt associates confusion and nausea as well as light sensitivity and sound sensitivity. Given codeine at [facility]. C/o left sided facial numnbness.  Left arm weaker grip than right. Pt answers all questions appropriately. No facial droop. No arm drift.

## 2012-06-18 NOTE — ED Notes (Signed)
Pt  Reports      Symptoms  Of  Headache    With  Neck pain  As  Some  tigling l  Side  Of  Face    Symptoms  X  3  Days  -  She  Also  Reports   Has  Had  Symptoms    Of     Sinus congestion as  Well

## 2012-06-19 NOTE — ED Provider Notes (Signed)
Medical screening examination/treatment/procedure(s) were conducted as a shared visit with non-physician practitioner(s) and myself.  I personally evaluated the patient during the encounter  Gradual onset headache with nuasea and photophobia.  Recent admission for stroke workup (negative).  Cranial nerves 3-12 intact, no nyastagmus, no ataxia on finger to nose. 5/5 strength throughout.  Glynn Octave, MD 06/19/12 [phone]

## 2012-06-22 ENCOUNTER — Encounter: Payer: Self-pay | Admitting: Family Medicine

## 2012-06-22 ENCOUNTER — Ambulatory Visit (INDEPENDENT_AMBULATORY_CARE_PROVIDER_SITE_OTHER): Payer: Commercial Managed Care - PPO | Admitting: Family Medicine

## 2012-06-22 ENCOUNTER — Ambulatory Visit: Payer: Commercial Managed Care - PPO | Admitting: Family Medicine

## 2012-06-22 VITALS — BP 135/98 | HR 107 | Ht 62.0 in | Wt 141.0 lb

## 2012-06-22 DIAGNOSIS — R5383 Other fatigue: Secondary | ICD-10-CM

## 2012-06-22 DIAGNOSIS — J309 Allergic rhinitis, unspecified: Secondary | ICD-10-CM

## 2012-06-22 DIAGNOSIS — R5381 Other malaise: Secondary | ICD-10-CM

## 2012-06-22 DIAGNOSIS — R51 Headache: Secondary | ICD-10-CM

## 2012-06-22 MED ORDER — FLUTICASONE PROPIONATE 50 MCG/ACT NA SUSP: 2.0000 | Freq: Every day | NASAL | Status: DC

## 2012-06-22 MED ORDER — CYCLOBENZAPRINE HCL 5 MG PO TABS: 5.0000 mg | ORAL_TABLET | Freq: Three times a day (TID) | ORAL | Status: AC | PRN

## 2012-06-22 NOTE — Patient Instructions (Addendum)
Use the Flonase 2 sprays in each nostril daily Use the Flexeril at night and during the day if you need it to relax your muscles.   Heat and massage for your neck.    It was good to see you!

## 2012-06-23 ENCOUNTER — Other Ambulatory Visit: Payer: Commercial Managed Care - PPO

## 2012-06-23 ENCOUNTER — Telehealth: Payer: Self-pay | Admitting: Family Medicine

## 2012-06-23 ENCOUNTER — Encounter: Payer: Self-pay | Admitting: Family Medicine

## 2012-06-23 ENCOUNTER — Other Ambulatory Visit: Payer: Self-pay | Admitting: Family Medicine

## 2012-06-23 DIAGNOSIS — R0981 Nasal congestion: Secondary | ICD-10-CM

## 2012-06-23 DIAGNOSIS — R51 Headache: Secondary | ICD-10-CM

## 2012-06-23 DIAGNOSIS — G8929 Other chronic pain: Secondary | ICD-10-CM

## 2012-06-23 DIAGNOSIS — E041 Nontoxic single thyroid nodule: Secondary | ICD-10-CM

## 2012-06-23 LAB — CBC WITH DIFFERENTIAL/PLATELET
Basophils Absolute: 0 10*3/uL (ref 0.0–0.1)
Eosinophils Relative: 1 % (ref 0–5)
HCT: 36.7 % (ref 36.0–46.0)
Lymphocytes Relative: 40 % (ref 12–46)
Lymphs Abs: 3.5 10*3/uL (ref 0.7–4.0)
MCH: 29.7 pg (ref 26.0–34.0)
MCV: 85.2 fL (ref 78.0–100.0)
Monocytes Absolute: 0.6 10*3/uL (ref 0.1–1.0)
RDW: 14 % (ref 11.5–15.5)
WBC: 8.7 10*3/uL (ref 4.0–10.5)

## 2012-06-23 LAB — COMPREHENSIVE METABOLIC PANEL
BUN: 11 mg/dL (ref 6–23)
CO2: 28 mEq/L (ref 19–32)
Calcium: 10 mg/dL (ref 8.4–10.5)
Chloride: 105 mEq/L (ref 96–112)
Creat: 0.71 mg/dL (ref 0.50–1.10)

## 2012-06-23 LAB — TSH: TSH: 2.724 u[IU]/mL (ref 0.350–4.500)

## 2012-06-23 MED ORDER — BACLOFEN 10 MG PO TABS: 5.0000 mg | ORAL_TABLET | Freq: Three times a day (TID) | ORAL | Status: AC

## 2012-06-23 MED ORDER — AMOXICILLIN-POT CLAVULANATE 875-125 MG PO TABS: 1.0000 | ORAL_TABLET | Freq: Two times a day (BID) | ORAL | Status: AC

## 2012-06-23 MED ORDER — ALBUTEROL SULFATE HFA 108 (90 BASE) MCG/ACT IN AERS: 2.0000 | INHALATION_SPRAY | Freq: Four times a day (QID) | RESPIRATORY_TRACT | Status: DC | PRN

## 2012-06-23 NOTE — Assessment & Plan Note (Signed)
Likely migraine. 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  Migraine cocktail provided in ED did not provide relief. She does demonstrate muscle stiffness Left side of neck.  Trial of Flexeril.   Ibuprofen for relief, she still has some Vicodin provided by ED at home.   She also has history of allergic rhinitis, trial of Flonase to see if this helps. FU in 2 weeks for improvement.

## 2012-06-23 NOTE — Progress Notes (Signed)
  Subjective:    Patient ID: Terri Montgomery, female    DOB: [birth year], 51 y.o.   MRN: [id number]  HPI  1.  Headaches:  51 yo F with history of parathyriodectomy complaining of several month history of Left sided neck pain and headaches.  Worse in past week or so.  Describes Left sided pain that starts in back of neck and radiates to Left side of head.  Occurs everyday as low level pain, but occasionally worsens in flare.  She went to Urgent Care and then to ED on 8/8 and was provided migraine cocktail without relief.  Some relief of pain with Vicodin.    She does complain of some bilateral allergic rhinitis, would like trial of nasal spray to see if this helps with her headache.  Occasional with some sinus pain as well when her headaches are bad.  No nausea or vomiting, no auras, no weakness/numbness.  Review of Systems See HPI above for review of systems.       Objective:   Physical Exam Gen:  Alert, cooperative patient who appears stated age in no acute distress.  Vital signs reviewed. HEENT:  /AT.  EOMI, PERRL.  MMM, tonsils non-erythematous, non-edematous.  External ears WNL, Bilateral TM's normal without retraction, redness or bulging.  Neck:  Tenderness Left SCM muscle and Left trapezius.  Trapezius is also tight on Left compared to Right.   Cardiac:  Regular rate and rhythm without murmur auscultated.  Good S1/S2. Pulm:  Clear to auscultation bilaterally with good air movement.  No wheezes or rales noted.   Neuro:  CN II - XII intact.  No gross motor function deficits BL UE's or LE's.        Assessment & Plan:

## 2012-06-23 NOTE — Progress Notes (Signed)
CMP,CBC WITH DIFF,TSH AND ESR DONE TODAY Terri Montgomery

## 2012-06-23 NOTE — Progress Notes (Signed)
Subjective:    Patient ID: Terri Montgomery, female    DOB: 06/10/1961, 51 y.o.   MRN: [id number]  HPI 1.  Patient returned today with Headaches:  Please see prior phone notes regarding the course of the patient's headaches that lasted last night until this AM.  Patient still with pain.  Describes left sided nasal and frontal maxillary sinus pain and pressure which she feels radiates down into her neck.  Describes headache as worse on Left side but bilateral pain, mostly frontal.  Has not taken anything else for pain relief.  As noted before, headache became acutely worse after she used her Flonase last night.  She denies any fevers or chills, though she does endorse hot flashes at times which are chronic but which she feels have increased since she starting having worsening headaches.  Also with shortness of breath and mild cough at night.  Previously provided albuterol inhaler which helped with this in past.  No chest pain, diaphoresis, nausea or vomiting.    Of note, patient did say that when she was in [place] prior to moving here she was taking Topamax on a daily basis for migraine prevention.     Review of Systems See HPI above for review of systems.       Objective:   Physical Exam Gen:  Patient wearing sunglasses in room.  I note that she smiles when I walk in room and it is symmetrical.  She is able to remove her sunglasses with her left hand and replaced them easily with her left hand.  Uses both right and left hands equally to push herself up from chair and from examination table.  Head: Normocephalic atraumatic Eyes: EOMI, PERRL, sclera and conjunctiva non-erythematous. Patient does wince when light shown in her eyes. Nose:  Nasal turbinates enlarged BL, but +3 on Left.  Tender to palpation over maxillary sinus on Left Mouth: Mucosa membranes moist. Tonsils +2, nonenlarged, non-erythematous. Neck: No cervical lymphadenopathy noted.  Tender to palpation Left trapezius muscle and SCM  on Left side.  30 degree rotation to Left side, 45 degree to Right side.  Flexion to 45 degrees, extension to 25 degrees, limited by headache and NOT neck pain Heart:  RRR, no murmurs auscultated. Pulm:  Clear to auscultation bilaterally with good air movement.  No wheezes or rales noted.   Neuro:  Patient will not comply with ocular testing due to headache pain.  Pupils were equal and reacted BL.  CN V, VII - XII intact BL.  Patient with 3/5 strength during testing RUE.  5/5 LUE and BL LE"s.  DTR's +2 triceps, brachioradialis, and achilles BL.  As noted above, when not directly testing and when distracting patient, she uses her Left hand equally to Right, able to push herself from examination table using Left hand and arm.        Assessment & Plan:   1.  Headaches:  Believe this is multi-factorial in nature.  I do believe she is having acute migraine attack, possibly triggered by Left neck pain.  She holds her neck in stiff position to limit movement of her head.  She is tender over Left neck muscles.  I think she would benefit from increased dose of pain reliever (short-term), Vicodin provided by previous physician has provided somewhat relief.    Also, she has evidence of migraine changes to white matter on MRI.  Evidently she was on Topamax previously, may need to restart this or another prophylactic medication in the future.  Do not think meningitis or encephalitis as she has no altered mental status and no fevers or chills.    Checking CBC with diff and ESR today.   No evidence of stroke on neurological testing today.  No history of stroke, negative MRI/MRA head and neck in May.  Also she has evidence of inspissated material on MRI in Left maxillary sinus and what she describes as chronic sinus pain/pressure on that side, worse today.  Concern is that she may have chronic sinusitis.  Plan to attempt course of Augmentin and, if no relief, referral to ENT.

## 2012-06-23 NOTE — Patient Instructions (Addendum)
I think there might 2 things going on causing your headaches:  1)  The nasal congestion that you have been having, we could also see that on the MRI.  I will do a course of antibiotics to see if we can knock that out.  The next step will be a referral to the Ear Nose and Throat doctor.  2)  The neck pain and stiffness:  I think if we can get this under control it will help as well.  I'm sending in another muscle relaxer for you.  Split it in half and take it before bed.  You can also take 1.5 to 2 pills of the Vicodin every 6 hours if you need to.  The Vicodin and muscle relaxer will make you sleepy.    The MRI did show changes from migraines as well as the chronic sinus congestion.    We will also do physical therapy referral for your neck.

## 2012-06-23 NOTE — Telephone Encounter (Signed)
Received page from patient's daughter as she was concerned about her mother's continued headache.  Evidently patient went home and headache persisted after I saw her yesterday.  She tried taking a Vicodin at 2 AM last night but had no relief.  The headache persists until this morning.  I was able to speak with [name] a little more about her mother's headaches -- they seem to have been off and on for past 1-2 months (basically since around time of prior admission in May), but worsening in past 10 - 14 days.  I will call her mother now to find out more how she's doing.  I spoke with patient -- she is "doing horrible" in her words.  She went home after picking up the Flonase and Flexeril.  She did 2 sprays in each nostril and about an hour later began having much worsening of her pain.  She took a Flexeril without relief.  Otherwise she was as her daughter stated: took Vicodin without relief at 2 AM and has been basically awake since then due to the pain.  She also describes some facial numbness.  I do note inspissated material within left sinus.  A trial of Augmentin might be worthwhile.  Other thought is fungal material.  Will obtain CBC with diff and ESR to rule out temporal arteritis as well. She is on her way to clinic now.

## 2012-06-24 ENCOUNTER — Other Ambulatory Visit: Payer: Commercial Managed Care - PPO

## 2012-06-25 ENCOUNTER — Telehealth: Payer: Self-pay | Admitting: Family Medicine

## 2012-06-25 NOTE — Telephone Encounter (Signed)
Called and discussed normal lab findings with patient.  She is still having headaches but they have greatly improved.  Has only had to take vicodin once and that was yesterday.  Taking Augmentin BID.  She has follow-up appointment next week, will discuss further at that time.

## 2012-07-02 ENCOUNTER — Ambulatory Visit: Payer: Commercial Managed Care - PPO | Admitting: Family Medicine

## 2012-07-06 ENCOUNTER — Ambulatory Visit: Payer: Commercial Managed Care - PPO | Admitting: Family Medicine

## 2012-07-06 ENCOUNTER — Encounter: Payer: Self-pay | Admitting: Family Medicine

## 2012-07-06 ENCOUNTER — Ambulatory Visit (INDEPENDENT_AMBULATORY_CARE_PROVIDER_SITE_OTHER): Payer: Commercial Managed Care - PPO | Admitting: Family Medicine

## 2012-07-06 VITALS — BP 136/78 | HR 66 | Ht 62.0 in | Wt 143.0 lb

## 2012-07-06 DIAGNOSIS — R51 Headache: Secondary | ICD-10-CM

## 2012-07-06 DIAGNOSIS — Z1231 Encounter for screening mammogram for malignant neoplasm of breast: Secondary | ICD-10-CM

## 2012-07-06 DIAGNOSIS — Z1239 Encounter for other screening for malignant neoplasm of breast: Secondary | ICD-10-CM

## 2012-07-06 MED ORDER — TRAMADOL HCL 50 MG PO TABS: 50.0000 mg | ORAL_TABLET | Freq: Three times a day (TID) | ORAL | Status: AC | PRN

## 2012-07-06 NOTE — Patient Instructions (Addendum)
Take the Tramadol when you feel pain.  Take this only when you need it.  Come back after your vacation.  If you're having more pain, we may need to use something to take everyday.  Below is some information on fibromyalgia.    Fibromyalgia Fibromyalgia is a disorder that is often misunderstood. It is associated with muscular pains and tenderness that comes and goes. It is often associated with fatigue and sleep disturbances. Though it tends to be long-lasting, fibromyalgia is not life-threatening. CAUSES  The exact cause of fibromyalgia is unknown. People with certain gene types are predisposed to developing fibromyalgia and other conditions. Certain factors can play a role as triggers, such as:  Spine disorders.   Arthritis.   Severe injury (trauma) and other physical stressors.   Emotional stressors.  SYMPTOMS   The main symptom is pain and stiffness in the muscles and joints, which can vary over time.   Sleep and fatigue problems.  Other related symptoms may include:  Bowel and bladder problems.   Headaches.   Visual problems.   Problems with odors and noises.   Depression or mood changes.   Painful periods (dysmenorrhea).   Dryness of the skin or eyes.  DIAGNOSIS  There are no specific tests for diagnosing fibromyalgia. Patients can be diagnosed accurately from the specific symptoms they have. The diagnosis is made by determining that nothing else is causing the problems. TREATMENT  There is no cure. Management includes medicines and an active, healthy lifestyle. The goal is to enhance physical fitness, decrease pain, and improve sleep. HOME CARE INSTRUCTIONS   Only take over-the-counter or prescription medicines as directed by your caregiver. Sleeping pills, tranquilizers, and pain medicines may make your problems worse.   Low-impact aerobic exercise is very important and advised for treatment. At first, it may seem to make pain worse. Gradually increasing your  tolerance will overcome this feeling.   Learning relaxation techniques and how to control stress will help you. Biofeedback, visual imagery, hypnosis, muscle relaxation, yoga, and meditation are all options.   Anti-inflammatory medicines and physical therapy may provide short-term help.   Acupuncture or massage treatments may help.   Take muscle relaxant medicines as suggested by your caregiver.   Avoid stressful situations.   Plan a healthy lifestyle. This includes your diet, sleep, rest, exercise, and friends.   Find and practice a hobby you enjoy.   Join a fibromyalgia support group for interaction, ideas, and sharing advice. This may be helpful.  SEEK MEDICAL CARE IF:  You are not having good results or improvement from your treatment. FOR MORE INFORMATION  National Fibromyalgia Association: www.fmaware.org Arthritis Foundation: www.arthritis.org Document Released: 10/28/2005 Document Revised: 10/17/2011 Document Reviewed: 02/07/2010 [facility] Patient Information 2012 [place], [place].

## 2012-07-06 NOTE — Progress Notes (Signed)
  Subjective:    Patient ID: Terri Montgomery, female    DOB: [birth year], 51 y.o.   MRN: [id number]  HPI 1.  Follow up for headaches:  Much improved over past several weeks.  She states that since taking the Augmentin she no longer has any sinus congestion or pain.  Did have episode of body aches last week that lasted for several days, but no further headaches during that time.  She is very grateful that they have stopped.  No fevers or chills.  Denies symptoms of depression.    Of note, patient mentions in passing that she had history of fibromyalgia in the past.    Review of Systems See HPI above for review of systems.       Objective:   Physical Exam Gen:  Alert, cooperative patient who appears stated age in no acute distress.  Vital signs reviewed. HEENT:  [place]/AT.  EOMI, PERRL.  MMM, tonsils non-erythematous, non-edematous.  External ears WNL, Bilateral TM's normal without retraction, redness or bulging.  Cardiac:  Regular rate and rhythm without murmur auscultated.  Good S1/S2. Pulm:  Clear to auscultation bilaterally with good air movement.  No wheezes or rales noted.   Neuro:  Alert and oriented to person, place, and date.  CN II-XII intact.  No focal deficits noted.          Assessment & Plan:

## 2012-07-08 ENCOUNTER — Encounter: Payer: Self-pay | Admitting: Family Medicine

## 2012-07-08 NOTE — Assessment & Plan Note (Signed)
Much improved.   Hopefully secondary to chronic sinus congestion and she won't have much more trouble with this. However, she does mentioned fibromyalgia and therefore she likely will continue having issues with headaches.   She is going on vacation next week.  Provided short-term course of Tramadol for relief if she has further pain as abortive therapy. Will switch to more appropriate fibromyalgia management if she continues to have upon her return.  Hopefully the relaxation from vacation will help with this.

## 2012-07-10 ENCOUNTER — Ambulatory Visit: Payer: Commercial Managed Care - PPO

## 2012-07-11 IMAGING — US US SOFT TISSUE HEAD/NECK
1 series · 13 of 25 positions shown · non-contrast
Comparison: Nuclear medicine parathyroid scan of 12/24/2011

CLINICAL DATA: Evaluate for possible parathyroid lesion

THYROID ULTRASOUND
TECHNIQUE: Ultrasound examination of the thyroid gland and adjacent
soft tissues was performed.

[Series 1: us soft tissue head/neck · 0.07mm/px · 13 of 43 slices shown]
[im 1/43]
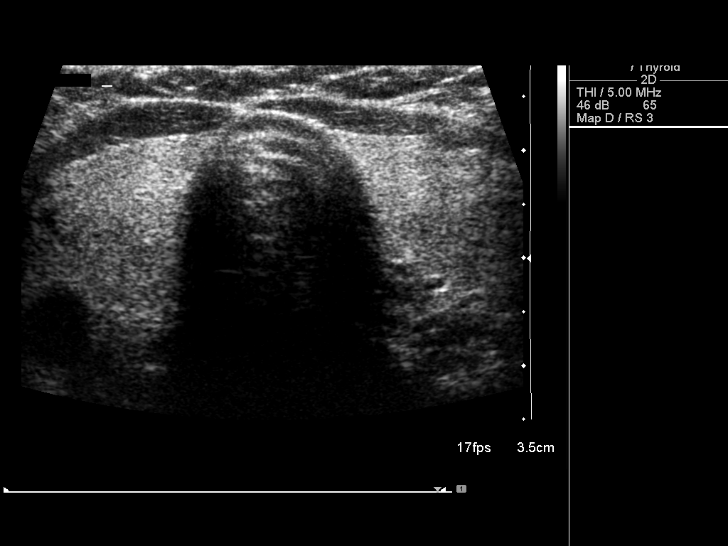
[im 4/43]
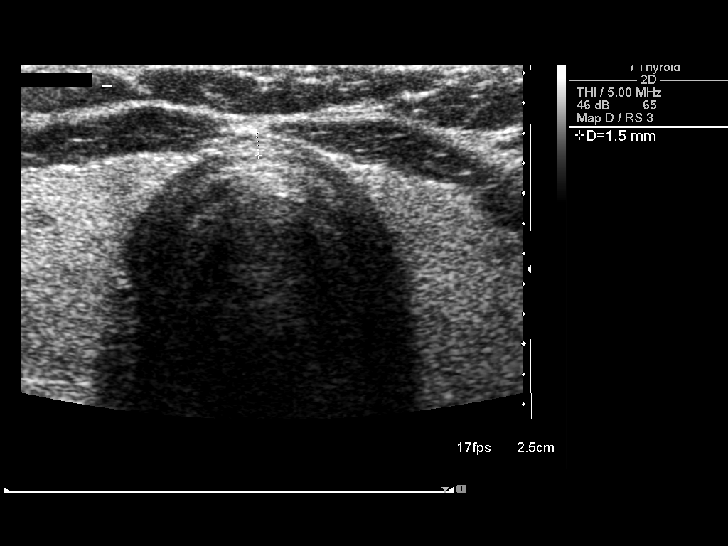
[im 8/43]
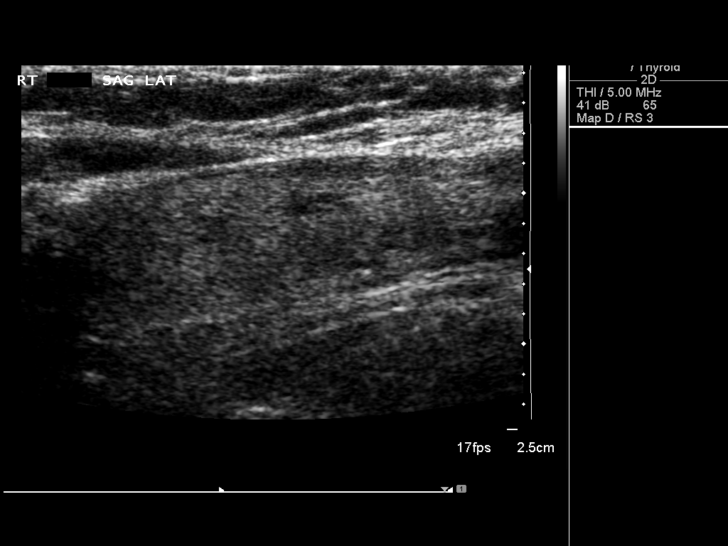
[im 11/43]
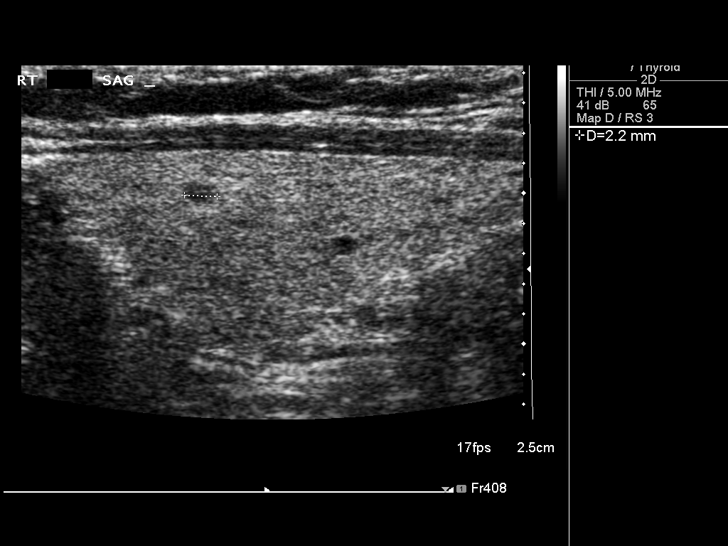
[im 15/43]
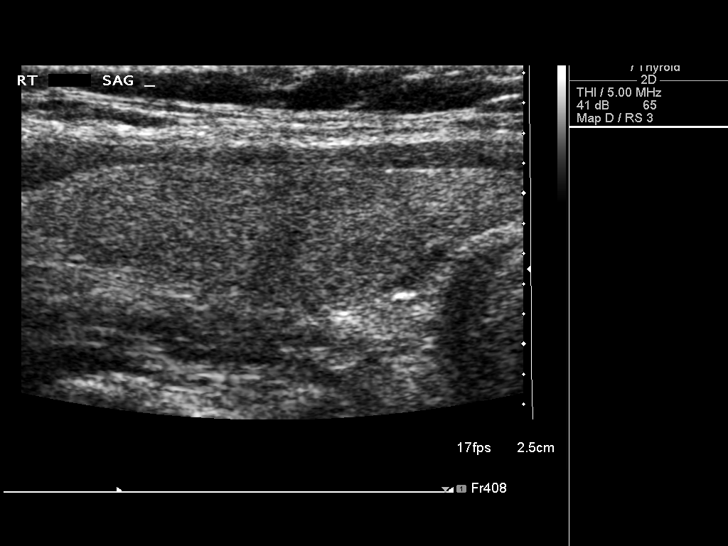
[im 18/43]
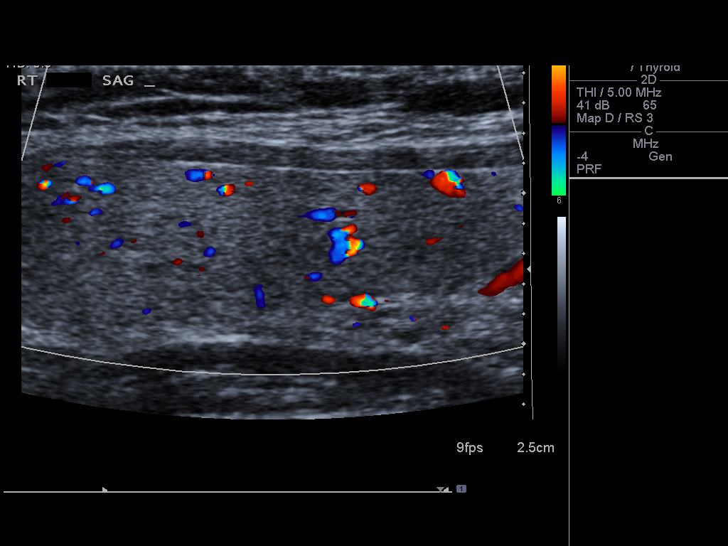
[im 22/43]
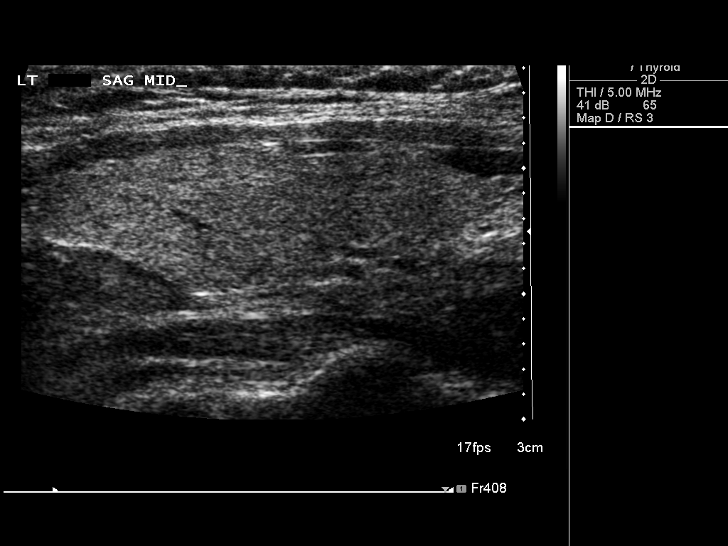
[im 25/43]
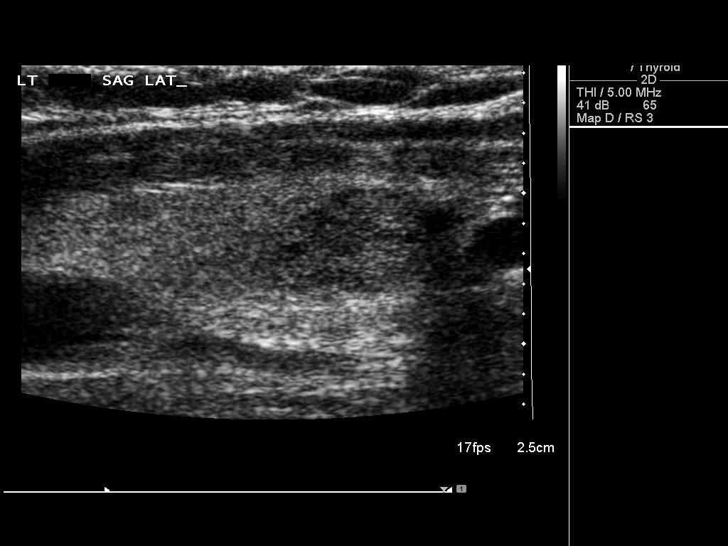
[im 29/43]
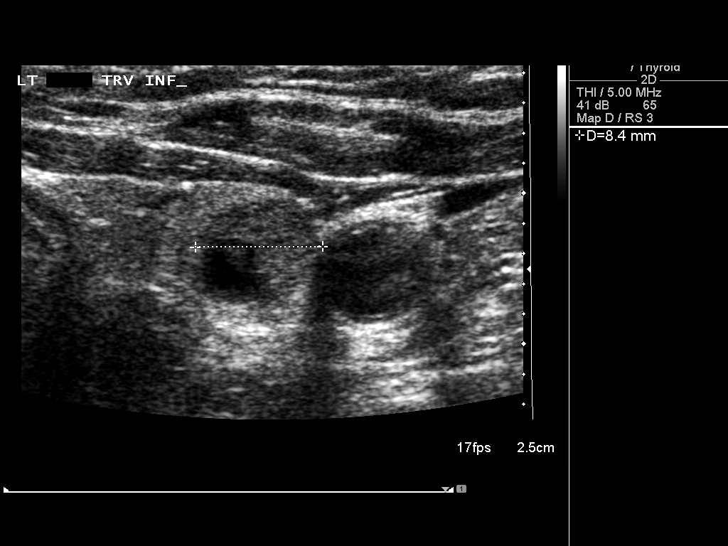
[im 32/43]
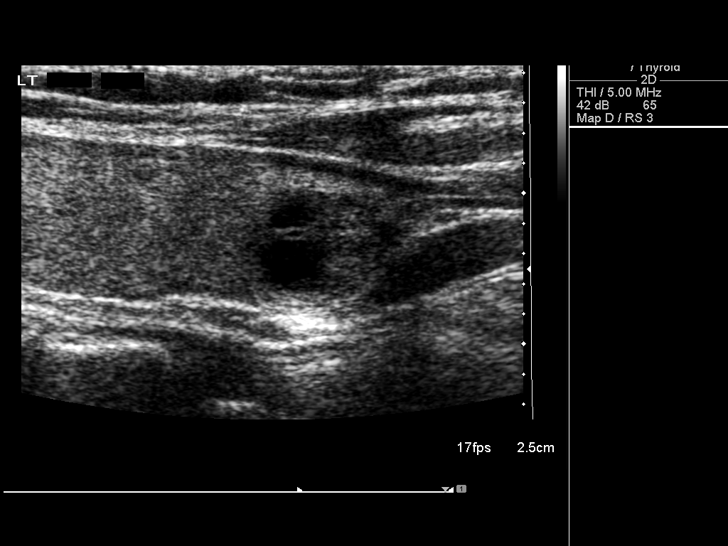
[im 36/43]
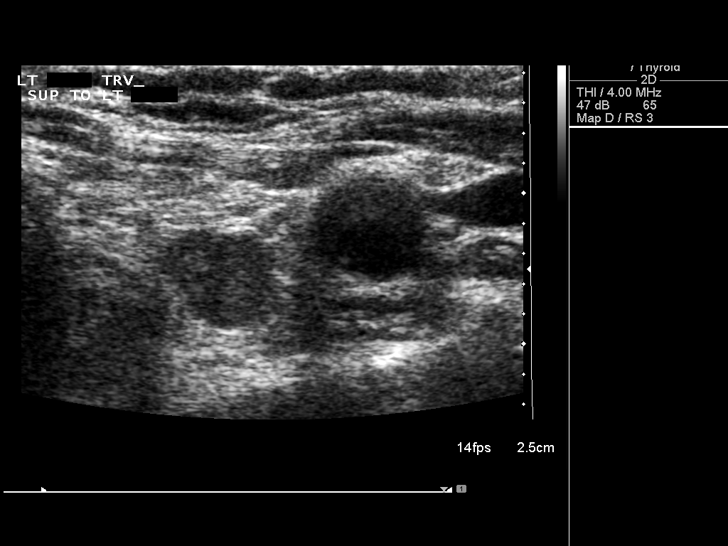
[im 39/43]
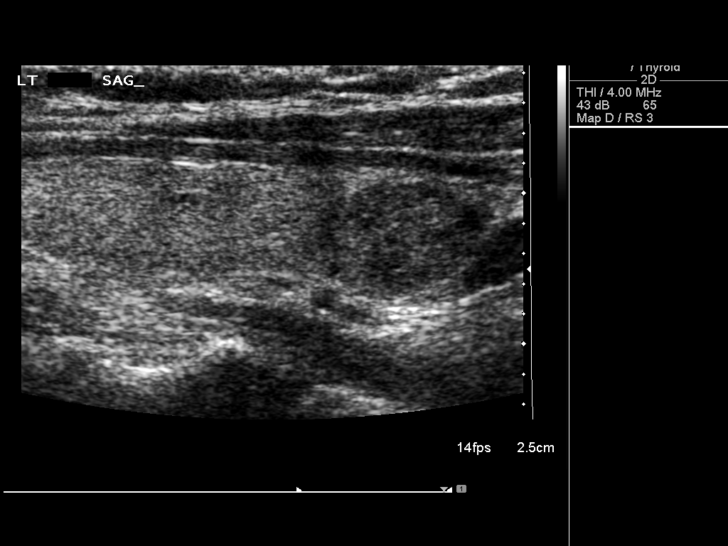
[im 43/43]
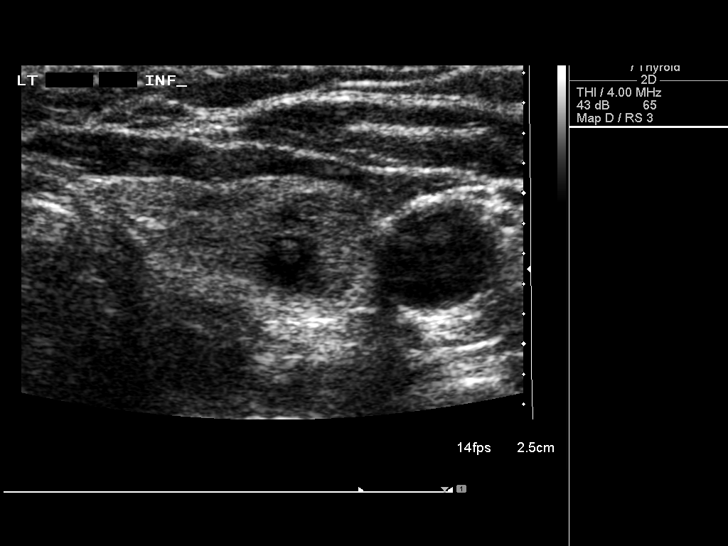

[13 of 25 positions shown; findings below may reference images not displayed]

FINDINGS: Right thyroid lobe:  5.1 x 1.4 x 1.8 cm.
Left thyroid lobe:  3.9 x 1.2 x 1.8 cm.
Isthmus:  2 mm in thickness.

Focal nodules:  The echogenicity of the thyroid gland is slightly
inhomogeneous.  There is a solid somewhat hypoechoic nodule in the
lower pole of the left lobe of 1.0 x 0.9 x 0.8 cm.  Smaller nodules
are present of only a few millimeters in diameter.

However, just superior to the left thyroid gland there is an oval
soft tissue mass present of 1.6 x 0.7 x 0.8 cm.  Correlating with
the nuclear medicine parathyroid scan, this is consistent with a
left superior parathyroid adenoma.

Lymphadenopathy:  None visualized.
IMPRESSION: 1.  The thyroid gland is normal in size with a single 10 mm solid
nodule in the lower pole of the left lobe.
2.  However, just above the left lobe there is an oval soft tissue
mass present consistent with a left superior parathyroid adenoma
when compared to the recent nuclear medicine parathyroid scan.

## 2012-07-29 ENCOUNTER — Ambulatory Visit: Payer: Commercial Managed Care - PPO

## 2012-09-25 ENCOUNTER — Encounter: Payer: Self-pay | Admitting: Family Medicine

## 2012-09-25 ENCOUNTER — Ambulatory Visit (INDEPENDENT_AMBULATORY_CARE_PROVIDER_SITE_OTHER): Payer: Commercial Managed Care - PPO | Admitting: Family Medicine

## 2012-09-25 VITALS — BP 130/84 | HR 61 | Temp 97.9°F | Ht 62.0 in | Wt 141.0 lb

## 2012-09-25 DIAGNOSIS — R1031 Right lower quadrant pain: Secondary | ICD-10-CM

## 2012-09-25 DIAGNOSIS — R300 Dysuria: Secondary | ICD-10-CM | POA: Insufficient documentation

## 2012-09-25 LAB — POCT URINALYSIS DIPSTICK
Bilirubin, UA: NEGATIVE
Glucose, UA: NEGATIVE
Nitrite, UA: NEGATIVE

## 2012-09-25 LAB — CBC WITH DIFFERENTIAL/PLATELET
Eosinophils Absolute: 0.1 10*3/uL (ref 0.0–0.7)
Lymphocytes Relative: 47 % — ABNORMAL HIGH (ref 12–46)
Lymphs Abs: 3.6 10*3/uL (ref 0.7–4.0)
Neutrophils Relative %: 46 % (ref 43–77)
Platelets: 355 10*3/uL (ref 150–400)
RBC: 4.86 MIL/uL (ref 3.87–5.11)
WBC: 7.8 10*3/uL (ref 4.0–10.5)

## 2012-09-25 LAB — BASIC METABOLIC PANEL
CO2: 30 mEq/L (ref 19–32)
Calcium: 10.8 mg/dL — ABNORMAL HIGH (ref 8.4–10.5)
Chloride: 104 mEq/L (ref 96–112)
Sodium: 139 mEq/L (ref 135–145)

## 2012-09-25 LAB — POCT UA - MICROSCOPIC ONLY

## 2012-09-25 MED ORDER — OMEPRAZOLE 40 MG PO CPDR: 40.0000 mg | DELAYED_RELEASE_CAPSULE | Freq: Every day | ORAL | Status: DC

## 2012-09-25 MED ORDER — ONDANSETRON 4 MG PO TBDP: 4.0000 mg | ORAL_TABLET | Freq: Three times a day (TID) | ORAL | Status: DC | PRN

## 2012-09-25 NOTE — Patient Instructions (Addendum)
Fue un [employer], [employer].  Creo que Environmental health practitioner estomacal pudiera ser producto de una gastritis viral o de reflujo.   Para la nausea, mande' una receta para Ondansetron (zofran) 4mg , una tableta cada 8 horas segun necesite para nausea.    Quiero que tome OMEPRAZOLE 40mg  una tableta TODOS LOS DIAS; para bloquear el acido que creo que esta' contribuyendo al dolor abdominal.  Le llamo cuando tenga los resultados de los laboratorios que hicimos hoy.

## 2012-09-25 NOTE — Progress Notes (Signed)
  Subjective:    Patient ID: Terri Montgomery, female    DOB: June 20, 1961, 51 y.o.   MRN: [id number]  HPI Terri Montgomery presents today with complaint of 1-2 weeks of GI complaints, which started with right-sided flank and epigastric pain, accompanied by watery nonbloody diarrhea.  She has had anorexia and some nausea over this time span as well.  Eating very little, mostly chicken.  In the past week she has noticed the diarrhea turn into constipation, which she has had intermittently over many years.  No fevers or chills, no vomiting but significant nausea.  She has noticed her urine to have a strong odor and has had some burning discomfort with void, no polyuria.    No sick contacts, lives with husband only. No travel or changes in dietary patterns.  She reports having had H pylori testing and treatment in the past.  Not taking meds for her GI complaints at the present time.  Has felt acid in the throat and has gagged her at night when laying down in bed.  Wakes up choking and coughing.   Surgical Hx; S/p hysterectomy for fibroids; L breast lumpectomy for "cysts"; cholecystectomy.    Social Hx Never-smoker, never used alcohol regularly.  Review of Systems See above     Objective:   Physical Exam Alert, well appearing, no apparent distress HEENT Neck supple. No cervical adenopathy. Clear oropharynx. EOMI. Moist mucus membranes.  COR Regular S1S2 PULM Clear bilaterally. No rales or wheezes ABD Soft, audible bowel sounds throughout.  Some tenderness in RLQ and epigastrium; no suprapubic tenderness.  No hepatomegaly noted. No CVA tenderness.  No guarding.  No LE edema noted       Assessment & Plan:

## 2012-09-25 NOTE — Assessment & Plan Note (Signed)
Acute onset of abdominal pain which on exam appears focused in epigastrium and RLQ.  Initially with diarrhea, now more constipation in nature.  She is not taking NSAIDs or other meds that would be likely to induce gastritis.  Has simplified her diet to all-organic, no caffeine, no carbonated beverages, and still no improvement.  Plan to check UA to rule out UTI in light of dysuria; CBC today as well.  Continue simplified diet; start PPI and may use antiemetic on as-needed basis.  For close follow up if not improving in short order.

## 2012-09-28 ENCOUNTER — Encounter: Payer: Self-pay | Admitting: Family Medicine

## 2012-09-28 ENCOUNTER — Telehealth: Payer: Self-pay | Admitting: Family Medicine

## 2012-09-28 NOTE — Telephone Encounter (Signed)
Called and left voice mail; notified patient that labs are essentially normal.  She is to call if not improving since her recent visit.  Paula Compton, MD

## 2012-10-01 NOTE — Telephone Encounter (Signed)
Noted. Terri Montgomery fyi only.

## 2012-10-01 NOTE — Telephone Encounter (Signed)
Pt called to let Dr Mauricio Po know that she is still not much better- thinks that it just has to run it's course.  Doesn't need a call back

## 2012-10-05 ENCOUNTER — Telehealth: Payer: Self-pay | Admitting: Family Medicine

## 2012-10-05 DIAGNOSIS — R1031 Right lower quadrant pain: Secondary | ICD-10-CM

## 2012-10-05 NOTE — Telephone Encounter (Signed)
Patient has questions about the results of her blood test.

## 2012-10-05 NOTE — Telephone Encounter (Signed)
I called patient back, she reports she is worse, with worsening loss of appetite and now with chills and night sweats.  Has worse pain in epigastrium radiating to throat, and with fetid odor from breath.  Has seen small amounts of blood when she clears her throat in the morning.  The constipation has resolved, daily bowel movements without blood in stool.  The RLQ pain is less intense but still there.   In discussing her current status, I have recommended a referral to GI for possible endoscopy; also, a CT abd/pelvis with contrast in light of continued pain, night sweats, anorexia.  She voices agreement and understanding.  Paula Compton, MD

## 2012-10-05 NOTE — Addendum Note (Signed)
Addended by: Barbaraann Barthel on: 10/05/2012 02:17 PM   Modules accepted: Orders

## 2012-10-05 NOTE — Telephone Encounter (Signed)
Called pt and went over letter and labs. She was concerned about lymphocyetes 47 %. I explained to the pt, that this number is insignificant and within normal range. Not any thing to worry about. Urine did not show infection. Pt complains of still feeling sick and bad. I advised her to schedule f/up appt, if she is not improving. Pt agreed. Lorenda Hatchet, Renato Battles

## 2012-10-05 NOTE — Telephone Encounter (Signed)
Called pt. Informed of appt for Ct scan at [facility] Fri at 11 am. Pt to pick up contrast at Radiology. She is allergic to Iodine and needs 'allergy prep'. Pt voices understanding. [facility] will call pt with appt. Lorenda Hatchet, Renato Battles

## 2012-10-06 ENCOUNTER — Ambulatory Visit (HOSPITAL_COMMUNITY): Payer: Commercial Managed Care - PPO

## 2012-10-07 ENCOUNTER — Telehealth: Payer: Self-pay | Admitting: Family Medicine

## 2012-10-07 NOTE — Telephone Encounter (Signed)
1.) called Cindee Lame and she reports, that the pt has not picked up the prep kit. I told Cindee Lame that I have told the pt to pick up the prep kit on Wed, because she needs it for her appt for Fri,  and also the scheduler that pt has Iodine allergy. (she previous phone calls) 2.) I called the pt and she reports, that she has picked up the prep on Monday already from the Radiology Dep. I told the pt to touch base with Cindee Lame at Radiology and make SURE, that the right prep kit was given to her (allergy kit). Pt will do that right now. Lorenda Hatchet, Renato Battles

## 2012-10-07 NOTE — Telephone Encounter (Signed)
Patient is allergice to Iodine, so if Dr. Mauricio Po still wants with contrast, then she will need an allergy prep.

## 2012-10-09 ENCOUNTER — Encounter (HOSPITAL_COMMUNITY): Payer: Self-pay

## 2012-10-09 ENCOUNTER — Ambulatory Visit (HOSPITAL_COMMUNITY)
Admission: RE | Admit: 2012-10-09 | Discharge: 2012-10-09 | Disposition: A | Discharge status: Home / Self Care | Payer: Commercial Managed Care - PPO | Source: Ambulatory Visit | Attending: Family Medicine | Admitting: Family Medicine

## 2012-10-09 DIAGNOSIS — M51379 Other intervertebral disc degeneration, lumbosacral region without mention of lumbar back pain or lower extremity pain: Secondary | ICD-10-CM | POA: Insufficient documentation

## 2012-10-09 DIAGNOSIS — Q619 Cystic kidney disease, unspecified: Secondary | ICD-10-CM | POA: Insufficient documentation

## 2012-10-09 DIAGNOSIS — R1031 Right lower quadrant pain: Secondary | ICD-10-CM | POA: Insufficient documentation

## 2012-10-09 DIAGNOSIS — M5137 Other intervertebral disc degeneration, lumbosacral region: Secondary | ICD-10-CM | POA: Insufficient documentation

## 2012-10-09 DIAGNOSIS — K7689 Other specified diseases of liver: Secondary | ICD-10-CM | POA: Insufficient documentation

## 2012-10-09 DIAGNOSIS — R1013 Epigastric pain: Secondary | ICD-10-CM | POA: Insufficient documentation

## 2012-10-09 MED ORDER — IOHEXOL 300 MG/ML  SOLN
100.0000 mL | Freq: Once | INTRAMUSCULAR | Status: AC | PRN
  Administered 2012-10-09: 100 mL via INTRAVENOUS

## 2012-10-12 ENCOUNTER — Telehealth: Payer: Self-pay | Admitting: Family Medicine

## 2012-10-12 NOTE — Telephone Encounter (Signed)
Called patient to report results of CT abd/pelvis.  Patient does not know of any future appointment with GI.  Will route to see if this is scheduled by [facility] staff, or if the GI team will contact patient with appt.  Paula Compton, MD

## 2012-10-12 NOTE — Telephone Encounter (Signed)
Called pt. Informed that [facility] had called her and left a message to call them to schedule an appt. (see under referral). Pt said, that she did not get the message.  I gave pt [facility] # and she will call them to schedule appt. Lorenda Hatchet, Renato Battles

## 2012-10-27 ENCOUNTER — Telehealth: Payer: Self-pay | Admitting: Family Medicine

## 2012-10-27 NOTE — Telephone Encounter (Signed)
Left message for patient to return call. Patient needs to schedule an office visit with Dr Mauricio Po.Terri Montgomery, Terri Montgomery

## 2012-10-27 NOTE — Telephone Encounter (Signed)
Patient would like to speak to Dr. Mauricio Po about some symptoms that she is still having.

## 2012-11-16 ENCOUNTER — Encounter: Payer: Self-pay | Admitting: Internal Medicine

## 2012-11-18 ENCOUNTER — Ambulatory Visit: Payer: Commercial Managed Care - PPO | Admitting: Internal Medicine

## 2013-01-18 ENCOUNTER — Ambulatory Visit (INDEPENDENT_AMBULATORY_CARE_PROVIDER_SITE_OTHER): Payer: Commercial Managed Care - PPO | Admitting: Family Medicine

## 2013-01-18 ENCOUNTER — Ambulatory Visit: Payer: Commercial Managed Care - PPO

## 2013-01-18 ENCOUNTER — Telehealth: Payer: Self-pay | Admitting: Family Medicine

## 2013-01-18 VITALS — BP 125/80 | HR 90 | Temp 98.5°F | Ht 62.0 in | Wt 141.7 lb

## 2013-01-18 DIAGNOSIS — A088 Other specified intestinal infections: Secondary | ICD-10-CM

## 2013-01-18 DIAGNOSIS — R112 Nausea with vomiting, unspecified: Secondary | ICD-10-CM

## 2013-01-18 DIAGNOSIS — A084 Viral intestinal infection, unspecified: Secondary | ICD-10-CM | POA: Insufficient documentation

## 2013-01-18 DIAGNOSIS — R1031 Right lower quadrant pain: Secondary | ICD-10-CM

## 2013-01-18 LAB — COMPREHENSIVE METABOLIC PANEL
ALT: 36 U/L — ABNORMAL HIGH (ref 0–35)
AST: 34 U/L (ref 0–37)
Alkaline Phosphatase: 111 U/L (ref 39–117)
Calcium: 10.1 mg/dL (ref 8.4–10.5)
Chloride: 107 mEq/L (ref 96–112)
Creat: 0.56 mg/dL (ref 0.50–1.10)
Total Bilirubin: 0.4 mg/dL (ref 0.3–1.2)

## 2013-01-18 MED ORDER — ONDANSETRON 4 MG PO TBDP: 4.0000 mg | ORAL_TABLET | Freq: Three times a day (TID) | ORAL | Status: DC | PRN

## 2013-01-18 NOTE — Telephone Encounter (Signed)
States she ate out at [employer] yesterday and since has started with vomiting and diarrhea. Has vo vomited several  times this AM. Appointment scheduled this AM for evaluation.

## 2013-01-18 NOTE — Telephone Encounter (Signed)
Having diarrhea and throwing up since last night - wants to come in today

## 2013-01-18 NOTE — Patient Instructions (Addendum)
You likely have gastroenteritis.  I have called in Zofran for your nausea.   Tried and stay well hydrated.  Be sure to drink plenty of fluids.  Please call/return if you don't improve.  I will call if your lab results are abnormal.

## 2013-01-18 NOTE — Progress Notes (Signed)
Subjective:     Patient ID: Terri Montgomery, female   DOB: 06/16/1961, 52 y.o.   MRN: [id number]  HPI 52 year old female presents for same day visit with CC of N/V, abdominal cramping, and chills.  1) N/V, abdominal cramping, chills - Started suddenly last night (3 am) with nausea and vomiting.  Emesis described as yellow in coloration.  Emesis has occurred 4 times. - Patient reports associated chills and diffuse body aches. - Denies fever. Reports soft stool x 2.  Reports little PO intake today. - Has not taken anything to attempt to alleviate symptoms.  No exacerbating factors.  - She reports eating at [employer] yesterday afternoon - Ate chicken, and schrimp.  No other individuals got sick from meal.   - Patient also reports that her daughter has been sick with similar symptoms.  Review of Systems Denies dysuria, difficulty urinating.  For remainder of ROS see HPI.    Objective:   Physical Exam General: appears in mild distress secondary to discomfort and nausea Heart: RRR. No murmurs, rubs, or gallops. Lungs: CTAB. No rales, rhonchi, or wheeze. Abdomen: soft, nondistended, mild periumbilical tenderness. No guarding. No organomegaly.    Assessment:     Plan:

## 2013-01-18 NOTE — Assessment & Plan Note (Signed)
Patient likely has viral gastroenteritis.  DDX - food borne illness, viral gastro, Influenza. Will treat with PRN Tylenol (fevers, chills) and Zofran for nausea. Influenza unlikely given lack of fever and respiratory symptoms. Patient encouraged to drink plenty of fluids and return if she does not improve or worsens.

## 2013-01-19 ENCOUNTER — Other Ambulatory Visit: Payer: Self-pay | Admitting: Family Medicine

## 2013-01-19 ENCOUNTER — Telehealth: Payer: Self-pay | Admitting: Family Medicine

## 2013-01-19 MED ORDER — TRAMADOL HCL 50 MG PO TABS: 50.0000 mg | ORAL_TABLET | Freq: Four times a day (QID) | ORAL | Status: DC | PRN

## 2013-01-19 NOTE — Telephone Encounter (Signed)
Patient notified if headache worsening, , fever of 101 or more will need to come back to clinic  for re evaluation.

## 2013-01-19 NOTE — Telephone Encounter (Signed)
Patient was seen by Dr. Adriana Simas yesterday for nausea and vomiting but she also had a migraine and she was told to take aspirin for it but it isn't helping.  She wants this to go to Dr. Mauricio Po and she is asking if something can be sent in to her pharmacy for the migraine.  She also wants to let him know that she is still vomitting, diarrhia so bad that she messes in her pants before she can get to the hospital.  She still hasn't been able to keep anything in.

## 2013-01-19 NOTE — Telephone Encounter (Signed)
Patient states she has vomited once this AM and has had diarrhea x 2 today. Still very watery. Unable to drink because she is still nauseated. Had Zofran on hand and is taking this.  States she woke up with migraine headache  And her muscled, chest and throat hurts.  Wants something for headache, ibuprofen is not helping. Advised to try sipping on clear liquids slowly. Dr. Adriana Simas notified and he advises he will send in something for headache.

## 2013-01-21 ENCOUNTER — Telehealth: Payer: Self-pay | Admitting: Family Medicine

## 2013-01-21 NOTE — Telephone Encounter (Signed)
Patient called back.  Denies any shortness of breath, chest pain, or left side pain.  Was able to eat some soup.  Patient states she canceled appt with Dr. Gwendolyn Grant for tomorrow due to transportation issues and rescheduled appt for 01/25/13.  Patient informed to go to ED or urgent care if she develops any chest pain, shortness of breath, fatigue, or nausea.  Patient verbalized understanding.  Gaylene Brooks, RN

## 2013-01-21 NOTE — Telephone Encounter (Signed)
If she's having chest pain, shortness of breath, fatigue, and nausea she should be evaluated sooner than tomorrow.  I would recommend Urgent Care or ED evaluation today

## 2013-01-21 NOTE — Telephone Encounter (Signed)
Returned call to patient.  Had vomiting and was seen by Dr. Adriana Simas and dx'd with norovirus.  Has not had anymore vomiting or diarrhea.  Had normal bowel movement this morning.  Been able to drink liquids.  Woke up this morning with "sharp pain under left side and breast."  Rates pain 8/10.  "Hurts more when lying down."  C/o weakness, fatigue, and short of breath.  Offered appt today with a different provider.  Patient requesting to see Dr. Gwendolyn Grant if possible.  Informed that Dr. Gwendolyn Grant does not have any openings today and again offered appt with different provider.  Patient declined and was offered appt with Dr. Gwendolyn Grant for tomorrow.  Patient agreeable and appt scheduled for tomorrow at 11:00 am.     Gaylene Brooks, RN

## 2013-01-21 NOTE — Telephone Encounter (Signed)
Called patient and left message to call our office back.  Richardson, Jeannette Ann, RN  

## 2013-01-21 NOTE — Telephone Encounter (Signed)
Pt was here on Monday and is having pain under her ribs - feels like she needs to come back to see Dr Terri Montgomery today

## 2013-01-22 ENCOUNTER — Ambulatory Visit: Payer: Commercial Managed Care - PPO | Admitting: Family Medicine

## 2013-01-25 ENCOUNTER — Ambulatory Visit (INDEPENDENT_AMBULATORY_CARE_PROVIDER_SITE_OTHER): Payer: Commercial Managed Care - PPO | Admitting: Family Medicine

## 2013-01-25 ENCOUNTER — Encounter: Payer: Self-pay | Admitting: Family Medicine

## 2013-01-25 VITALS — BP 146/99 | HR 91 | Temp 97.8°F | Ht 62.0 in | Wt 138.0 lb

## 2013-01-25 DIAGNOSIS — R300 Dysuria: Secondary | ICD-10-CM

## 2013-01-25 DIAGNOSIS — Z1231 Encounter for screening mammogram for malignant neoplasm of breast: Secondary | ICD-10-CM

## 2013-01-25 DIAGNOSIS — Z0000 Encounter for general adult medical examination without abnormal findings: Secondary | ICD-10-CM

## 2013-01-25 DIAGNOSIS — R109 Unspecified abdominal pain: Secondary | ICD-10-CM

## 2013-01-25 LAB — POCT URINALYSIS DIPSTICK
Bilirubin, UA: NEGATIVE
Ketones, UA: NEGATIVE
Spec Grav, UA: 1.015
pH, UA: 7

## 2013-01-25 LAB — POCT UA - MICROSCOPIC ONLY

## 2013-01-25 MED ORDER — DICLOFENAC SODIUM 75 MG PO TBEC: 75.0000 mg | DELAYED_RELEASE_TABLET | Freq: Two times a day (BID) | ORAL | Status: DC

## 2013-01-25 MED ORDER — CIPROFLOXACIN HCL 500 MG PO TABS: 500.0000 mg | ORAL_TABLET | Freq: Two times a day (BID) | ORAL | Status: DC

## 2013-01-25 MED ORDER — IBUPROFEN 800 MG PO TABS: 800.0000 mg | ORAL_TABLET | Freq: Three times a day (TID) | ORAL | Status: DC | PRN

## 2013-01-25 NOTE — Progress Notes (Signed)
Terri Montgomery is a 52 y.o. female who presents to [facility] today with Right sided flank pain:  1.  Flank pain:  Recently seen for N/V/diarrhea on Monday of last week.  Diagnosed with viral gastroenteritis, provided Zofran for relief which eliminated her nausea.  Still with diarrhea which finally resolved on Wednesday of last week.    Doing better since then until Friday, when she began to experience Right sided flank pain radiated to her groin.  Describes as alternating dull achy pain with sharp stabbing pain.  She has trouble expressing how often throughout the day this occurs.  Has had some dysuria since that time as well with some increased frequency but no urgency.  Denies any fevers but has had some chills.  Endorses some mild nausea without overt vomiting that restarted again yesterday.  Has not taken Zofran for relief, was unsure if she should restart this medicine.      Of note she has history of RLQ pain both on prior appt's with me and listed in her PMH section.  When asked about this, she denies any pain on Right side and states it's always been left-sided pain.    She also endorses some supra-pubic and rectal pressure for past several days.  States that prior gyecologist had told her she would eventually need "bladder surgery" to "hold my bladder back up."  Denies any incontinence.  Denies any hematuria.    The following portions of the patient's history were reviewed and updated as appropriate: allergies, current medications, past medical history, family and social history, and problem list.  Patient is a nonsmoker.    Past Medical History  Diagnosis Date  . Hypertrophic pyloric stenosis   . GERD (gastroesophageal reflux disease)   . Anemia   . Arthritis   . Heart murmur   . PONV (postoperative nausea and vomiting)     HA post anesthesia  . Primary hyperparathyroidism 12/19/2011    Underwent removal of a hyperplastic left superior parathyroid March 2013, with improvement in symptoms   .  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 Abdominal pain, RLQ 09/03/2011    Patient with long history of abdominal pain 1999-2006, previously with pain in RLQ, told she would "need surgery" in [place] before moving to [place] in 2008.  Never had this surgery that had been recommended.  S/p ccy, TAH c BSO for fibromas (no cancer history).   . Headache    Past Surgical History  Procedure Laterality Date  . Abdominal hysterectomy  2002  . Cholecystectomy  1998  . Parathyroidectomy  01/21/2012    Procedure: PARATHYROIDECTOMY;  Surgeon: Currie Paris, MD;  Location: [facility] OR;  Service: General;  Laterality: N/A;  Left superior parathyroidectomy    Medications reviewed. Current Outpatient Prescriptions  Medication Sig Dispense Refill  . albuterol (PROVENTIL HFA;VENTOLIN HFA) 108 (90 BASE) MCG/ACT inhaler Inhale 2 puffs into the lungs every 6 (six) hours as needed for wheezing.  1 Inhaler  0  . fluticasone (FLONASE) 50 MCG/ACT nasal spray Place 2 sprays into the nose daily.  16 g  6  . omeprazole (PRILOSEC) 40 MG capsule Take 1 capsule (40 mg total) by mouth daily.  30 capsule  3  . ondansetron (ZOFRAN-ODT) 4 MG disintegrating tablet Take 1 tablet (4 mg total) by mouth every 8 (eight) hours as needed for nausea.  20 tablet  1  . pseudoephedrine (SUDAFED) 120 MG 12 hr tablet Take  120 mg by mouth every 12 (twelve) hours.      . traMADol (ULTRAM) 50 MG tablet Take 1 tablet (50 mg total) by mouth every 6 (six) hours as needed for pain.  30 tablet  0   No current facility-administered medications for this visit.    ROS as above otherwise neg.      Objective:   Physical Exam BP 146/99  Pulse 91  Temp(Src) 97.8 F (36.6 C) (Oral)  Ht 5\' 2"  (1.575 m)  Wt 138 lb (62.596 kg)  BMI 25.23 kg/m2 Gen:  Alert, cooperative patient who appears stated age in no acute distress.  Vital signs reviewed. HEENT:  EOMI, MMM Cardiac:  Regular rate and rhythm  without murmur auscultated.   Pulm:  Clear to auscultation bilaterally with good air movement.  No wheezes or rales noted.   Abd:  Soft/nondistended.  TTP along Right flank at mid-axillary line with some tenderness Right inguinal area as well.  Tenderness is mild and without guarding or rebound.  Abdomen otherwise benign.  Rosving's negative.  Mild CVA tenderness present on Right side, negative on Left.   GYN:  External genitalia within normal limits.  Normal appearing vaginal mucosa for age, pink, minimal rugae.  Nonfriable cervix without lesions, no discharge or bleeding noted on speculum exam.  Bimanual exam revealed surgically absent uterus.  Minimal adnexal tenderness BL.  No adnexal masses/ovaries palpated BL.     Rectal:  Patient in left lateral recumbent position, lying comfortably. Large external hemorrhoid present at 3 o clock position.  Otherwise normal rectum.  Good rectal tone.  No internal hemorrhoids noted.  No stool in vault.   Ext:  No clubbing/cyanosis/erythema.  No edema noted bilateral lower extremities.     No results found for this or any previous visit (from the past 72 hour(s)).

## 2013-01-25 NOTE — Patient Instructions (Addendum)
Your urine test is concerning for a urinary tract infection or possibly a kidney stone.   I would like to treat you with antibiotics to clear any infection.  Keep taking the Zofran for relief.  I am also sending in some Tramadol as a pain reliever to help with your back pain.    Let me know by Wednesday if you are not feeling any better from the antibiotics.  Let me know sooner if you are worsening.    Do not take the Ibuprofen and Diclofenac together.  You can start the Ibuprofen AFTER you have stopped the Diclofenac.

## 2013-01-26 DIAGNOSIS — R109 Unspecified abdominal pain: Secondary | ICD-10-CM | POA: Insufficient documentation

## 2013-01-26 LAB — URINE CULTURE
Colony Count: NO GROWTH
Organism ID, Bacteria: NO GROWTH

## 2013-01-26 NOTE — Assessment & Plan Note (Addendum)
Does not appear to be actual RLQ pain as initially started in Right flank/back and radiates to Right groin.  Also with dysuria. UA reveals moderate blood with little other overt evidence of infection. Working diagnosis UTI/pyelo versus nephrolithiasis.   Will treat with Diclofenac for pain relief and start 10 day course of antibiotics to treat for presumptive pyelo.   I will talk with patient on Wednesday.  If she has no improvement in pain or if she notes in any gross hematuria, will refer for helical CT scan to assess for stone.   FU for improvement later this week.   She is also asking for a refill of her 800 mg Ibuprofen for chronic back pain and headaches.  Provided refill for this with clear verbal and written instructions to not start this until AFTER she has finished her Diclofenac.

## 2013-01-27 ENCOUNTER — Telehealth: Payer: Self-pay | Admitting: Family Medicine

## 2013-01-27 DIAGNOSIS — R109 Unspecified abdominal pain: Secondary | ICD-10-CM

## 2013-01-27 NOTE — Telephone Encounter (Signed)
Patient still with dysuria and flank pain plus nausea.  No overt vomiting.  No fevers/chills. Feels that pain has somewhat worsened.  Mostly in her back, right side.  No gross hematuria.  We had discussed previously CT scan to assess for nephrolithiasis.  She is to continue anti-emetics and analgesics.  Will order helical CT today.  She is sheduled to return on Friday but if no improvement recommend she come back to be evaluated sooner.  She declines an appointment to be seen this PM.    [facility] team, will you call Ms Forquer with CT appt when it's been scheduled?  Thanks, Trey Paula

## 2013-01-28 ENCOUNTER — Ambulatory Visit (HOSPITAL_COMMUNITY)
Admission: RE | Admit: 2013-01-28 | Discharge: 2013-01-28 | Disposition: A | Discharge status: Home / Self Care | Payer: Commercial Managed Care - PPO | Source: Ambulatory Visit | Attending: Family Medicine | Admitting: Family Medicine

## 2013-01-28 ENCOUNTER — Encounter (HOSPITAL_COMMUNITY): Payer: Self-pay

## 2013-01-28 DIAGNOSIS — R109 Unspecified abdominal pain: Secondary | ICD-10-CM | POA: Insufficient documentation

## 2013-01-28 DIAGNOSIS — R112 Nausea with vomiting, unspecified: Secondary | ICD-10-CM | POA: Insufficient documentation

## 2013-01-28 DIAGNOSIS — R3129 Other microscopic hematuria: Secondary | ICD-10-CM | POA: Insufficient documentation

## 2013-01-29 ENCOUNTER — Telehealth: Payer: Self-pay | Admitting: Family Medicine

## 2013-01-29 NOTE — Telephone Encounter (Signed)
Called and discussed normal CT scan of abdomen with patient, no evidence of stone.  She is feeling much better, no further nausea, no vomiting, no fevers/chills.  No further flank pain.  Feel this likely secondary to pyelo due to improvement with antibiotics.  Appt made for her to come back and see PCP on Tuesday at 11:30.

## 2013-02-01 ENCOUNTER — Other Ambulatory Visit (HOSPITAL_COMMUNITY): Payer: Commercial Managed Care - PPO

## 2013-02-02 ENCOUNTER — Telehealth: Payer: Self-pay | Admitting: *Deleted

## 2013-02-02 ENCOUNTER — Ambulatory Visit (INDEPENDENT_AMBULATORY_CARE_PROVIDER_SITE_OTHER): Payer: Commercial Managed Care - PPO | Admitting: Family Medicine

## 2013-02-02 ENCOUNTER — Telehealth: Payer: Self-pay | Admitting: Internal Medicine

## 2013-02-02 ENCOUNTER — Encounter: Payer: Self-pay | Admitting: Family Medicine

## 2013-02-02 VITALS — BP 144/84 | HR 58 | Temp 97.9°F | Ht 62.0 in | Wt 140.0 lb

## 2013-02-02 DIAGNOSIS — R1031 Right lower quadrant pain: Secondary | ICD-10-CM

## 2013-02-02 DIAGNOSIS — R32 Unspecified urinary incontinence: Secondary | ICD-10-CM | POA: Insufficient documentation

## 2013-02-02 LAB — CBC WITH DIFFERENTIAL/PLATELET
Basophils Absolute: 0 10*3/uL (ref 0.0–0.1)
Basophils Relative: 0 % (ref 0–1)
Eosinophils Absolute: 0.1 10*3/uL (ref 0.0–0.7)
Hemoglobin: 13.2 g/dL (ref 12.0–15.0)
MCH: 29.5 pg (ref 26.0–34.0)
MCHC: 35.1 g/dL (ref 30.0–36.0)
Monocytes Relative: 5 % (ref 3–12)
Neutrophils Relative %: 44 % (ref 43–77)
Platelets: 326 10*3/uL (ref 150–400)
RDW: 14.4 % (ref 11.5–15.5)

## 2013-02-02 MED ORDER — LACTULOSE 10 GM/15ML PO SOLN: 20.0000 g | Freq: Two times a day (BID) | ORAL | Status: DC | PRN

## 2013-02-02 MED ORDER — DICLOFENAC SODIUM 75 MG PO TBEC: 75.0000 mg | DELAYED_RELEASE_TABLET | Freq: Two times a day (BID) | ORAL | Status: DC

## 2013-02-02 NOTE — Telephone Encounter (Signed)
When I called Terri Montgomery back for the appt, I asked if the pain was urinary in nature; pt had a negative CT on 01/28/13. [name] stated pt is being referred to Urology, but, she is having rectal bleeding and constipation. Pt to see [name], PA in am.

## 2013-02-02 NOTE — Patient Instructions (Addendum)
It was a pleasure to see you today.   As we discussed, I believe it is very important that you see the gastroenterologist for additional workup of your changes in bowel habits, right-sided abdominal pain and the recent observation of blood in your stool.  In order to help you move your bowels, I am prescribing a liquid called Lactulose, take 1 to 2 tablespoons by mouth every 12 hours as needed to help you move your bowels. Stop the Cipro antibiotic.  Your urine culture did not grow any organisms, thus I am less suspicious of a urinary infection at this point.   I am refilling the Voltaren for your abdominal pain; do not take this medicine along with ibuprofen.   I am checking your blood count and a chemistry panel today, I will contact you with the results.   I would like you to make a follow up appointment with me in the coming 3-5 weeks, or call sooner if needed.   Urology referral for longstanding urinary incontinence.

## 2013-02-02 NOTE — Telephone Encounter (Signed)
Called and informed patient of appointment with [facility] on Wednesday March 26 at 9 am.Terri Montgomery, Rodena Medin

## 2013-02-03 ENCOUNTER — Encounter: Payer: Self-pay | Admitting: Physician Assistant

## 2013-02-03 ENCOUNTER — Telehealth: Payer: Self-pay | Admitting: Family Medicine

## 2013-02-03 ENCOUNTER — Ambulatory Visit (INDEPENDENT_AMBULATORY_CARE_PROVIDER_SITE_OTHER): Payer: Commercial Managed Care - PPO | Admitting: Physician Assistant

## 2013-02-03 ENCOUNTER — Encounter: Payer: Self-pay | Admitting: Gastroenterology

## 2013-02-03 VITALS — BP 120/84 | HR 84 | Ht 60.25 in | Wt 142.2 lb

## 2013-02-03 DIAGNOSIS — K625 Hemorrhage of anus and rectum: Secondary | ICD-10-CM

## 2013-02-03 DIAGNOSIS — R1031 Right lower quadrant pain: Secondary | ICD-10-CM

## 2013-02-03 LAB — BASIC METABOLIC PANEL
Calcium: 10.3 mg/dL (ref 8.4–10.5)
Glucose, Bld: 84 mg/dL (ref 70–99)
Sodium: 140 mEq/L (ref 135–145)

## 2013-02-03 MED ORDER — HYDROCORTISONE 2.5 % RE CREA: TOPICAL_CREAM | Freq: Four times a day (QID) | RECTAL | Status: DC

## 2013-02-03 MED ORDER — MOVIPREP 100 G PO SOLR: 1.0000 | Freq: Once | ORAL | Status: DC

## 2013-02-03 MED ORDER — DICYCLOMINE HCL 10 MG PO CAPS: ORAL_CAPSULE | ORAL | Status: DC

## 2013-02-03 NOTE — Progress Notes (Signed)
Reviewed and agree with management plan.  Tayler Lassen T. Azaliyah Kennard, MD FACG 

## 2013-02-03 NOTE — Assessment & Plan Note (Signed)
Longstanding problem, it does not appear that this is changed in any significant degree.  She was recently started on abx for presumed pyelo (Cipro), still taking and has 3 days left.  Urine cx showed no growth, therefore will stop the abx, as this may be contributing to her nausea and malaise.  For urology referral to discuss her incontinence.

## 2013-02-03 NOTE — Progress Notes (Signed)
  Subjective:    Patient ID: Terri Montgomery, female    DOB: [birth year], 52 y.o.   MRN: [id number]  HPI Patient here for follow up; says she still feels generalized fatigue and malaise.  Has had some chills but no fevers.  Notes from recent visits with Dr Gwendolyn Grant, as well as CT abd/pelvis reviewed by me before/during this visit.  Patient says she still has some RLQ and suprapubic pain, although it is made somewhat better with Voltaren.  She has had poor appetite.  She recounts the onset of this illness was about 2 weeks ago, at which time she had watery nonbloody diarrhea that came on shortly after her daughter-in-law contracted what was thought to be an infectious gastroenteritis.  The patient's pain and anorexia has not resolved since then, but her stools have become more formed and less frequent.  At her baseline she has two soft formed stools a day; at present she is having stool less than once a day.  Last BM on Saturday 3/22 had bright red blood.  This scared her.  Family Hx; Patient's father lives in [place] has been suffering from weight loss; patient suspects her father has cancer, but he has not disclosed details of his medical history when asked by family.  Patient had a daughter who died in childhood of a "bowel obstruction".    Social and PM Hx reviewed today and unchanged from previous visits.  Patient is s/p TAH c BSO in 2000 for fibromas, in [place].    Review of Systems  Has had urinary incontinence for many years, even before her hysterectomy surgery.  Perceives an urge to void but loses control before she makes it to the bathroom.      Objective:   Physical Exam Alert, able to give good history.  No acute distress. Tearful when describing her symptoms. HEENT Neck supple. No cervical adenopathy. Moist mucus membranes. Clear oropharynx COR Regular S1S2, no extra sounds.  PULM Clear bilaterally, no rales or wheezes.  ABD Audible bowel sounds.  Soft; tenderness in RLQ and  suprapubic area.  No true CVA tenderness.  No masses palpable. LEs no edema noted.        Assessment & Plan:

## 2013-02-03 NOTE — Progress Notes (Signed)
Subjective:    Patient ID: Terri Montgomery, female    DOB: 03-14-1961, 52 y.o.   MRN: [id number]  HPI  Terri Montgomery is a pleasant 52 year old female from [place] who is new to GI today. She is referred for evaluation of right-sided abdominal pain and rectal bleeding . She says she had undergone GI evaluation about 10 years ago while she was living in [place] and states that she had an upper endoscopy and was found to have a bacteria in her stomach for which she was treated. She apparently was also having other issues including lower abdominal pain and at one point was told that she may need to have surgery on her intestine but she did not and undergo any further workup, and is not exactly sure what was found. At this time she had onset of her current symptoms about 2 weeks ago with nausea vomiting and diarrhea. The diarrhea lasted for about 3 or 4 days was nonbloody was associated with some lower Donald cramping chills and fever. Since then the diarrhea has subsided but she has continued to have some nausea without vomiting and a decrease in her appetite. She says she feels extremely fatigued and has been having intermittent sweats. She also continues to have lower abdominal pain and sharp cramping in the right lower abdomen and also is uncomfortable in her lower back. She had been given a course of Cipro which she completed yesterday. She first noticed blood in her stool last Friday which she says initially was just on the tissue but she has seen blood with a bowel movement every day since. She is also describing some sharp pressure and discomfort in her rectum and feels that her right lower quadrant discomfort has gotten worse. She is now having normal bowel movements. She does feel that there are GI problems in her family but she has not aware of specific diagnoses. She had undergone CT scan of the abdomen and pelvis on 02/02/2013 without IV contrast. This showed hepatic cysts without change from prior  imaging , status post cholecystectomy- otherwise negative exam. Labs done 02/02/2013 with unremarkable been at and CBC showing a WBC of 5.8 hemoglobin 13.2 hematocrit of 37.6 MCV of 83    Review of Systems  Constitutional: Positive for appetite change and fatigue.  HENT: Negative.   Eyes: Negative.   Respiratory: Negative.   Cardiovascular: Negative.   Gastrointestinal: Positive for nausea, abdominal pain and blood in stool.  Endocrine: Negative.   Genitourinary: Negative.   Musculoskeletal: Negative.   Skin: Negative.   Allergic/Immunologic: Negative.   Neurological: Negative.   Hematological: Negative.   Psychiatric/Behavioral: Negative.    Outpatient Prescriptions Prior to Visit  Medication Sig Dispense Refill  . ibuprofen (ADVIL,MOTRIN) 800 MG tablet Take 1 tablet (800 mg total) by mouth every 8 (eight) hours as needed for pain.  30 tablet  1  . albuterol (PROVENTIL HFA;VENTOLIN HFA) 108 (90 BASE) MCG/ACT inhaler Inhale 2 puffs into the lungs every 6 (six) hours as needed for wheezing.  1 Inhaler  0  . ciprofloxacin (CIPRO) 500 MG tablet Take 1 tablet (500 mg total) by mouth 2 (two) times daily.  20 tablet  0  . diclofenac (VOLTAREN) 75 MG EC tablet Take 1 tablet (75 mg total) by mouth 2 (two) times daily.  20 tablet  0  . fluticasone (FLONASE) 50 MCG/ACT nasal spray Place 2 sprays into the nose daily.  16 g  6  . lactulose (CHRONULAC) 10 GM/15ML solution Take 30 mLs (  20 g total) by mouth 2 (two) times daily as needed.  240 mL  0  . omeprazole (PRILOSEC) 40 MG capsule Take 1 capsule (40 mg total) by mouth daily.  30 capsule  3  . ondansetron (ZOFRAN-ODT) 4 MG disintegrating tablet Take 1 tablet (4 mg total) by mouth every 8 (eight) hours as needed for nausea.  20 tablet  1  . pseudoephedrine (SUDAFED) 120 MG 12 hr tablet Take 120 mg by mouth every 12 (twelve) hours.       No facility-administered medications prior to visit.   Allergies  Allergen Reactions  . Demerol Shortness  Of Breath  . Iodine Shortness Of Breath  . Penicillins Shortness Of Breath  . Imitrex (Sumatriptan Base) Other (See Comments)    Increases HA  . Morphine And Related Nausea And Vomiting    In low doses patient did not experience negative side effects  . Tramadol     Headaches and shortness of breath   Patient Active Problem List  Diagnosis  . Abdominal pain, RLQ  . Fatigue  . Left arm weakness  . Facial droop  . Chest pain  . Headache  . Blurred vision  . Dysuria  . Viral enteritis  . Flank pain  . Urinary incontinence   History  Substance Use Topics  . Smoking status: Never Smoker   . Smokeless tobacco: Never Used  . Alcohol Use: No   family history includes Colon polyps in her brother and mother; Diabetes in her brother and mother; Heart disease in her daughter, maternal grandmother, and paternal grandmother; Irritable bowel syndrome in her mother; Kidney disease in her mother; Other in her daughter; Other (age of onset: 8) in her daughter; Prostate cancer in her father; and Ulcerative colitis in her mother.  There is no history of Anesthesia problems.     Objective:   Physical Exam . well-developed middle-aged female in no acute distress. Blood pressure 120/84 pulse 84 height 5 foot weight 142. HEENT; nontraumatic normocephalic EOMI PERRLA sclera anicteric,Neck; Supple no JVD, Cardiovascular; regular rate and rhythm with S1-S2 no murmur or gallop, Pulmonary; clear bilaterally, Abdomen; soft, nondistended, she is tender in the right mid quadrant and right lower quadrant and also mildly across the lower abdomen there is no guarding or rebound no palpable mass or hepatosplenomegaly, Rectal; exam she is a small external hemorrhoid which is inflamed and tender to palpation on digital exam no lesion felt stool is brown and Hemoccult., Extremities; no clubbing cyanosis or edema skin warm dry, Psych; mood and affect normal and appropriate        Assessment & Plan:   #20 52 year old  female with 2-3 week history of nausea lower abdominal  Pain, primarily right-sided- self-limited diarrhea and now hematochezia. Etiology of her symptoms is not clear however due to ongoing complaints of sharp lower and right lower ordered abdominal pain as well as hematochezia will need to rule out occult colon lesion or underlying colitis. #2 symptomatic external hemorrhoid #3 status post cholecystectomy   Plan; will schedule for colonoscopy with Dr. Jalene Mullet was discussed in detail with patient and she is agreeable to proceed. She has Zofran at home and is encouraged to use this q. 6 hours as needed for nausea Add Bentyl 10 mg by mouth up to 3 times daily as needed for abdominal pain and cramping Add empiric course of Prilosec 20 mg by mouth every morning x2 weeks Further plans pending results of colonoscopy

## 2013-02-03 NOTE — Patient Instructions (Addendum)
We sent prescriptions to [facility] [address] for Bentyl for cramping and the anusol hc cream for the rectal area.   We have given you samples of Prilosec OTC take 1 tab 30 min before breakfast for 14 days. Use Voltaren sparingly.  Take Zofran 4 mg 1 tab every 6 hours as needed for nausea.  Use the samples of Recticare ointment rectally.  You can you that over the counter at [facility].   We will call you if we have an earlier appointment.

## 2013-02-03 NOTE — Assessment & Plan Note (Addendum)
21 yoF with continued abdominal pain and change in bowel habits, now reporting red blood per rectum with most recent BM 2 days ago.  Abd/pelvic CT did not report on obstruction or mass.  I have explained to Terri Montgomery my recommendation that she see GI as soon as we can arrange a visit, and the reasons why this is so important.  Apparently she had an appointment for GI earlier this year but did not keep it.  Will give Lactulose to help with BMs for now; she reports that Colace and otc laxatives have not helped her in the past. CBC and metabolic panel today, given recent diarrhea and anorexia. Stop Cipro that was recently prescribed for empiric treatment of presumed pyelo; her UCx is no growth.

## 2013-02-03 NOTE — Telephone Encounter (Signed)
Called patient to report her lab results (unremarkable).  She was seen by GI yesterday. Is feelingbetter today since holding Cipro. JB

## 2013-02-16 ENCOUNTER — Ambulatory Visit (AMBULATORY_SURGERY_CENTER): Payer: Commercial Managed Care - PPO | Admitting: Gastroenterology

## 2013-02-16 ENCOUNTER — Encounter: Payer: Self-pay | Admitting: Gastroenterology

## 2013-02-16 VITALS — BP 119/72 | HR 75 | Temp 98.3°F | Resp 18 | Ht 60.0 in | Wt 142.0 lb

## 2013-02-16 DIAGNOSIS — R1031 Right lower quadrant pain: Secondary | ICD-10-CM

## 2013-02-16 DIAGNOSIS — K921 Melena: Secondary | ICD-10-CM

## 2013-02-16 DIAGNOSIS — K625 Hemorrhage of anus and rectum: Secondary | ICD-10-CM

## 2013-02-16 MED ORDER — SODIUM CHLORIDE 0.9 % IV SOLN: 500.0000 mL | INTRAVENOUS | Status: DC

## 2013-02-16 NOTE — Progress Notes (Signed)
Patient did not experience any of the following events: a burn prior to discharge; a fall within the facility; wrong site/side/patient/procedure/implant event; or a hospital transfer or hospital admission upon discharge from the facility. (G8907) Patient did not have preoperative order for IV antibiotic SSI prophylaxis. (G8918)  

## 2013-02-16 NOTE — Progress Notes (Signed)
Lidocaine-40mg IV prior to Propofol InductionPropofol given over incremental dosages 

## 2013-02-16 NOTE — Patient Instructions (Addendum)
YOU HAD AN ENDOSCOPIC PROCEDURE TODAY AT THE [facility]: Refer to the procedure report that was given to you for any specific questions about what was found during the examination.  If the procedure report does not answer your questions, please call your gastroenterologist to clarify.  If you requested that your care partner not be given the details of your procedure findings, then the procedure report has been included in a sealed envelope for you to review at your convenience later.  YOU SHOULD EXPECT: Some feelings of bloating in the abdomen. Passage of more gas than usual.  Walking can help get rid of the air that was put into your GI tract during the procedure and reduce the bloating. If you had a lower endoscopy (such as a colonoscopy or flexible sigmoidoscopy) you may notice spotting of blood in your stool or on the toilet paper. If you underwent a bowel prep for your procedure, then you may not have a normal bowel movement for a few days.  DIET: Your first meal following the procedure should be a light meal and then it is ok to progress to your normal diet.  A half-sandwich or bowl of soup is an example of a good first meal.  Heavy or fried foods are harder to digest and may make you feel nauseous or bloated.  Likewise meals heavy in dairy and vegetables can cause extra gas to form and this can also increase the bloating.  Drink plenty of fluids but you should avoid alcoholic beverages for 24 hours.  ACTIVITY: Your care partner should take you home directly after the procedure.  You should plan to take it easy, moving slowly for the rest of the day.  You can resume normal activity the day after the procedure however you should NOT DRIVE or use heavy machinery for 24 hours (because of the sedation medicines used during the test).    SYMPTOMS TO REPORT IMMEDIATELY: A gastroenterologist can be reached at any hour.  During normal business hours, 8:30 AM to 5:00 PM Monday through Friday,  call [phone].  After hours and on weekends, please call the GI answering service at [phone] who will take a message and have the physician on call contact you.   Following lower endoscopy (colonoscopy or flexible sigmoidoscopy):  Excessive amounts of blood in the stool  Significant tenderness or worsening of abdominal pains  Swelling of the abdomen that is new, acute  Fever of 100F or higher  Following upper endoscopy (EGD)  Vomiting of blood or coffee ground material  New chest pain or pain under the shoulder blades  Painful or persistently difficult swallowing  New shortness of breath  Fever of 100F or higher  Black, tarry-looking stools  FOLLOW UP: If any biopsies were taken you will be contacted by phone or by letter within the next 1-3 weeks.  Call your gastroenterologist if you have not heard about the biopsies in 3 weeks.  Our staff will call the home number listed on your records the next business day following your procedure to check on you and address any questions or concerns that you may have at that time regarding the information given to you following your procedure. This is a courtesy call and so if there is no answer at the home number and we have not heard from you through the emergency physician on call, we will assume that you have returned to your regular daily activities without incident.  SIGNATURES/CONFIDENTIALITY: You and/or your care   partner have signed paperwork which will be entered into your electronic medical record.  These signatures attest to the fact that that the information above on your After Visit Summary has been reviewed and is understood.  Full responsibility of the confidentiality of this discharge information lies with you and/or your care-partner.  

## 2013-02-16 NOTE — Op Note (Addendum)
[facility] [address] [place], [address]   COLONOSCOPY PROCEDURE REPORT  PATIENT: Terri, Montgomery  MR#: [id number] BIRTHDATE: [birth year] , 51  yrs. old GENDER: Female ENDOSCOPIST: Meryl Dare, MD, [facility] REFERRED XB:JYNWG Breen, M.D. PROCEDURE DATE:  02/16/2013 PROCEDURE:   Colonoscopy, diagnostic ASA CLASS:   Class II INDICATIONS:hematochezia and abdominal pain in the lower right quadrant. MEDICATIONS: MAC sedation, administered by CRNA and propofol (Diprivan) 300mg  IV DESCRIPTION OF PROCEDURE:   After the risks benefits and alternatives of the procedure were thoroughly explained, informed consent was obtained.  A digital rectal exam revealed no abnormalities of the rectum.   The LB [id number]  endoscope was introduced through the anus and advanced to the terminal ileum which was intubated for a short distance. No adverse events experienced.   The quality of the prep was excellent, using MoviPrep  The instrument was then slowly withdrawn as the colon was fully examined.  COLON FINDINGS: A normal appearing cecum, ileocecal valve, and appendiceal orifice were identified.  The ascending, hepatic flexure, transverse, splenic flexure, descending, sigmoid colon and rectum appeared unremarkable.  No polyps or cancers were seen. The mucosa appeared normal in the terminal ileum.  Retroflexed views revealed internal hemorrhoids. The time to cecum=3 minutes 16 seconds.  Withdrawal time=10 minutes 48 seconds.  The scope was withdrawn and the procedure completed. COMPLICATIONS: There were no complications.  ENDOSCOPIC IMPRESSION: 1.   Normal colon 2.   Normal mucosa in the terminal ileum  RECOMMENDATIONS: 1.  Continue current colorectal screening recommendations for "routine risk" patients with a repeat colonoscopy in 10 years. 2.   Prep H daily as needed for hemorrhoidal symptoms   eSigned:  Meryl Dare, MD, [facility] 02/16/2013 3:56  PM

## 2013-02-17 ENCOUNTER — Telehealth: Payer: Self-pay | Admitting: *Deleted

## 2013-02-17 NOTE — Telephone Encounter (Signed)
  Follow up Call-  Call back number 02/16/2013  Post procedure Call Back phone  # [phone]  Permission to leave phone message Yes     Patient questions:  Do you have a fever, pain , or abdominal swelling? no Pain Score  0 *  Have you tolerated food without any problems? yes  Have you been able to return to your normal activities? yes  Do you have any questions about your discharge instructions: Diet   no Medications  no Follow up visit  no  Do you have questions or concerns about your Care? no  Actions: * If pain score is 4 or above: No action needed, pain <4.

## 2013-02-18 ENCOUNTER — Ambulatory Visit: Payer: Commercial Managed Care - PPO

## 2013-02-23 ENCOUNTER — Ambulatory Visit: Payer: Commercial Managed Care - PPO

## 2013-02-24 ENCOUNTER — Ambulatory Visit
Admission: RE | Admit: 2013-02-24 | Discharge: 2013-02-24 | Disposition: A | Discharge status: Home / Self Care | Payer: Commercial Managed Care - PPO | Source: Ambulatory Visit | Attending: Family Medicine | Admitting: Family Medicine

## 2013-02-24 DIAGNOSIS — Z1231 Encounter for screening mammogram for malignant neoplasm of breast: Secondary | ICD-10-CM

## 2013-02-26 ENCOUNTER — Other Ambulatory Visit: Payer: Self-pay | Admitting: Family Medicine

## 2013-02-26 DIAGNOSIS — N6452 Nipple discharge: Secondary | ICD-10-CM

## 2013-02-26 DIAGNOSIS — N63 Unspecified lump in unspecified breast: Secondary | ICD-10-CM

## 2013-02-26 DIAGNOSIS — N644 Mastodynia: Secondary | ICD-10-CM

## 2013-03-10 ENCOUNTER — Telehealth: Payer: Self-pay | Admitting: *Deleted

## 2013-03-10 ENCOUNTER — Encounter: Payer: Self-pay | Admitting: Family Medicine

## 2013-03-10 ENCOUNTER — Ambulatory Visit (INDEPENDENT_AMBULATORY_CARE_PROVIDER_SITE_OTHER): Payer: Commercial Managed Care - PPO | Admitting: Family Medicine

## 2013-03-10 VITALS — BP 125/85 | HR 60 | Temp 98.0°F | Ht 62.0 in | Wt 140.0 lb

## 2013-03-10 DIAGNOSIS — M791 Myalgia, unspecified site: Secondary | ICD-10-CM

## 2013-03-10 DIAGNOSIS — IMO0001 Reserved for inherently not codable concepts without codable children: Secondary | ICD-10-CM

## 2013-03-10 DIAGNOSIS — R739 Hyperglycemia, unspecified: Secondary | ICD-10-CM

## 2013-03-10 DIAGNOSIS — J029 Acute pharyngitis, unspecified: Secondary | ICD-10-CM

## 2013-03-10 DIAGNOSIS — R7309 Other abnormal glucose: Secondary | ICD-10-CM

## 2013-03-10 LAB — POCT RAPID STREP A (OFFICE): Rapid Strep A Screen: NEGATIVE

## 2013-03-10 MED ORDER — MAGIC MOUTHWASH W/LIDOCAINE: ORAL | Status: DC

## 2013-03-10 MED ORDER — CEFDINIR 300 MG PO CAPS: 300.0000 mg | ORAL_CAPSULE | Freq: Two times a day (BID) | ORAL | Status: DC

## 2013-03-10 NOTE — Patient Instructions (Addendum)
The strep test was   We will treat you based on the length and severity of your symptoms.  Sometimes the tests are incorrect.  You should not be infectious 24 hours after starting the medicine.   Take the Omnicef (antibiotic) twice a day for 5 days.  Use the magic mouthwash every 6 hours if you need it for pain relief.  Swish and spit this medicine, don't swallow.    We will check your blood sugar today as well.    Try the swimming and walking like we talked about to see if this helps the Fibromyalgia.  Come back and see [place] in 2-3 weeks so we can see how you're doing at that time.

## 2013-03-10 NOTE — Telephone Encounter (Signed)
[facility] calling.  Received e-script for Magic Mouthwash with Lidocaine.  They need to know the ratio of mouthwash to lidocaine.  Will forward to Dr. Gwendolyn Grant for review.  Ileana Ladd

## 2013-03-10 NOTE — Progress Notes (Signed)
Subjective:    Terri Montgomery is a 52 y.o. female who presents to [facility] today with complaints of sore throat:  1.  Sore Throat:  For about 5 days. She states it is gradually worsening over the past several days. She's not had any other URI symptoms. She denies any itchy watery eyes, runny nose, cough. She states that she saw some "white spots" on the back of her throat and she went to the pharmacist to see if they could provide her with any medication. The pharmacist and corroborates the white spots but stated that she needed prescription for antibiotics. She had some ciprofloxacin left over from a previous prescription and has been taking this for the past 2 days. However this is not helped with her pain.  She's tried saltwater gargles multiple times a day as well as ibuprofen and Tylenol for pain relief. She has also been trying Chloraseptic throat spray without any relief.  No fevers but she does have chills. She states this is chronic for her but feels it is worse in the past several days. No nausea vomiting.  She also complains of muscle aches. This has been a recurrent issue an appointment with her in the past. She states this is limiting her from doing anything active. She denies any other evidence of depression. When asked about her mood she applies "good" but as noted below she has flat affect.   The following portions of the patient's history were reviewed and updated as appropriate: allergies, current medications, past medical history, family and social history, and problem list. Patient is a nonsmoker.    PMH reviewed.  Past Medical History  Diagnosis Date  . Hypertrophic pyloric stenosis   . GERD (gastroesophageal reflux disease)   . Anemia   . Arthritis   . Heart murmur   . PONV (postoperative nausea and vomiting)     HA post anesthesia  . Primary hyperparathyroidism 12/19/2011    Underwent removal of a hyperplastic left superior parathyroid March 2013, with improvement in symptoms    . 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 Abdominal pain, RLQ 09/03/2011    Patient with long history of abdominal pain 1999-2006, previously with pain in RLQ, told she would "need surgery" in [place] before moving to [place] in 2008.  Never had this surgery that had been recommended.  S/p ccy, TAH c BSO for fibromas (no cancer history).   . Headache   . Fibromyalgia   . Gallstones   . IBS (irritable bowel syndrome)   . Pneumonia   . Non-celiac gluten sensitivity    Past Surgical History  Procedure Laterality Date  . Abdominal hysterectomy  2002  . Cholecystectomy  1998  . Parathyroidectomy  01/21/2012    Procedure: PARATHYROIDECTOMY;  Surgeon: Currie Paris, MD;  Location: [facility] OR;  Service: General;  Laterality: N/A;  Left superior parathyroidectomy  . Breast cyst excision Left     Medications reviewed. Current Outpatient Prescriptions  Medication Sig Dispense Refill  . dicyclomine (BENTYL) 10 MG capsule Take 1 tab 3 times a day as needed for abd pain and cramping.  90 capsule  1  . hydrocortisone (ANUSOL-HC) 2.5 % rectal cream Place rectally 4 (four) times daily. Apply rectally a small amount of cream internally and externally .  30 g  1  . ibuprofen (ADVIL,MOTRIN) 800 MG tablet Take 1 tablet (800 mg total) by mouth every 8 (eight) hours as needed for  pain.  30 tablet  1   No current facility-administered medications for this visit.    ROS as above otherwise neg.  No chest pain, palpitations, SOB, Fever, Chills, Abd pain, N/V/D.   Objective:   Physical Exam BP 125/85  Pulse 60  Temp(Src) 98 F (36.7 C) (Oral)  Ht 5\' 2"  (1.575 m)  Wt 140 lb (63.504 kg)  BMI 25.6 kg/m2 Gen:  Patient sitting on exam table, appears stated age in no acute distress Head: Normocephalic atraumatic Eyes: EOMI, PERRL, sclera and conjunctiva non-erythematous Ears:  Canals clear bilaterally.  TMs pearly gray bilaterally without erythema or  bulging.   Nose:  Nasal turbinates WNL Mouth: Mucosa membranes moist. Tonsils are absent. She does have erythematous posterior oropharynx with what appear to be exudates noted left palatal pharyngeal arch.   Neck: No cervical lymphadenopathy noted Heart:  RRR, no murmurs auscultated. Pulm:  Clear to auscultation bilaterally with good air movement.  No wheezes or rales noted.   Pysch:  Depressed appearing.  Flat affect.    No results found for this or any previous visit (from the past 72 hour(s)).

## 2013-03-11 ENCOUNTER — Encounter: Payer: Self-pay | Admitting: Family Medicine

## 2013-03-12 ENCOUNTER — Telehealth: Payer: Self-pay | Admitting: Family Medicine

## 2013-03-12 DIAGNOSIS — J029 Acute pharyngitis, unspecified: Secondary | ICD-10-CM | POA: Insufficient documentation

## 2013-03-12 DIAGNOSIS — M791 Myalgia, unspecified site: Secondary | ICD-10-CM | POA: Insufficient documentation

## 2013-03-12 NOTE — Assessment & Plan Note (Signed)
Strep negative. She has been taking Cipro. After talking further she has taken one dose of amoxicillin as well from prior prescription. Possibility of confounding with antibiotic usage. Therefore will continue with Omnicef to treat possibility of strep pneumo -- physical exam is confirmatory.   Has an allergy listed to PCN and recommended to stop Amox.  Stop Cipro. FU prn.

## 2013-03-12 NOTE — Telephone Encounter (Signed)
Pharmacy is notified of ratios of ingredients.

## 2013-03-12 NOTE — Assessment & Plan Note (Signed)
I have had the opportunity to examine Mrs. Terri Montgomery multiple times in the past and she has appeared to have signs of depression during each of these encounters. However she has consistently denied any symptoms of depression. Myalgias and a constant complaint of hers going back to before parathyroidectomy. She does admit to history of fibromyalgia diagnosed when she lived in [place]. We did discuss the best treatment for this is an exercise and continued movement as well as social engagement. She is to swim for exercise and does endorse this improved her muscle aches. I recommended she take the next 2-3 weeks to go back to swimming. We will followup with her either myself or her PCP Dr. Mauricio Po at that time and evaluate for her myalgias have improved. May benefit from trial of Cymbalta for these pains and for what I believe is likely depression as well.   Evidently she has been on Armour Thyroid in the past which she said helped her mood and myalgias. I prefer to defer this to her PCP and hold off on this currently.

## 2013-03-12 NOTE — Telephone Encounter (Signed)
Ratio for magic mouthwash should be Rx:      30 ml viscous lidocaine 2%     60 ml Maalox (do not substitute Kaopectate)     30 ml diphenhydramine 12.5 mg per 5 ml elixir       Sig: Swish, gargle, and spit one to two teaspoonfuls every six hours as needed. Shake well before using.  Can you guys call pharmacy and let them know?  Thanks, Terri Montgomery

## 2013-03-12 NOTE — Telephone Encounter (Signed)
I spoke with Terri Montgomery when I called to report her husband's normal CBC  She reports that she still has a severe sore throat, despite continuing the Vantin that Dr Gwendolyn Grant prescribed. She hasn't developed a cough. She isn't taking anything for pain so I recommended 600 mg of Ibuprofen with meals.

## 2013-03-16 ENCOUNTER — Ambulatory Visit
Admission: RE | Admit: 2013-03-16 | Discharge: 2013-03-16 | Disposition: A | Discharge status: Home / Self Care | Payer: Commercial Managed Care - PPO | Source: Ambulatory Visit | Attending: Family Medicine | Admitting: Family Medicine

## 2013-03-16 DIAGNOSIS — N6452 Nipple discharge: Secondary | ICD-10-CM

## 2013-03-16 DIAGNOSIS — N644 Mastodynia: Secondary | ICD-10-CM

## 2013-03-16 DIAGNOSIS — N63 Unspecified lump in unspecified breast: Secondary | ICD-10-CM

## 2013-10-05 ENCOUNTER — Encounter: Payer: Self-pay | Admitting: Family Medicine

## 2013-10-05 ENCOUNTER — Encounter (HOSPITAL_COMMUNITY): Payer: Self-pay | Admitting: Emergency Medicine

## 2013-10-05 ENCOUNTER — Emergency Department (HOSPITAL_COMMUNITY): Payer: Commercial Managed Care - PPO

## 2013-10-05 ENCOUNTER — Emergency Department (HOSPITAL_COMMUNITY)
Admission: EM | Admit: 2013-10-05 | Discharge: 2013-10-06 | Disposition: A | Discharge status: Home / Self Care | Payer: Commercial Managed Care - PPO | Attending: Emergency Medicine | Admitting: Emergency Medicine

## 2013-10-05 ENCOUNTER — Ambulatory Visit (INDEPENDENT_AMBULATORY_CARE_PROVIDER_SITE_OTHER): Payer: Commercial Managed Care - PPO | Admitting: Family Medicine

## 2013-10-05 VITALS — BP 135/88 | Temp 98.0°F | Ht 62.0 in | Wt 143.5 lb

## 2013-10-05 DIAGNOSIS — R1031 Right lower quadrant pain: Secondary | ICD-10-CM

## 2013-10-05 DIAGNOSIS — Z8742 Personal history of other diseases of the female genital tract: Secondary | ICD-10-CM | POA: Insufficient documentation

## 2013-10-05 DIAGNOSIS — R109 Unspecified abdominal pain: Secondary | ICD-10-CM

## 2013-10-05 DIAGNOSIS — Z79899 Other long term (current) drug therapy: Secondary | ICD-10-CM | POA: Insufficient documentation

## 2013-10-05 DIAGNOSIS — Z8701 Personal history of pneumonia (recurrent): Secondary | ICD-10-CM | POA: Insufficient documentation

## 2013-10-05 DIAGNOSIS — Z8639 Personal history of other endocrine, nutritional and metabolic disease: Secondary | ICD-10-CM | POA: Insufficient documentation

## 2013-10-05 DIAGNOSIS — R52 Pain, unspecified: Secondary | ICD-10-CM

## 2013-10-05 DIAGNOSIS — R011 Cardiac murmur, unspecified: Secondary | ICD-10-CM | POA: Insufficient documentation

## 2013-10-05 DIAGNOSIS — Z880 Allergy status to penicillin: Secondary | ICD-10-CM | POA: Insufficient documentation

## 2013-10-05 DIAGNOSIS — R300 Dysuria: Secondary | ICD-10-CM

## 2013-10-05 DIAGNOSIS — R112 Nausea with vomiting, unspecified: Secondary | ICD-10-CM | POA: Insufficient documentation

## 2013-10-05 DIAGNOSIS — K5900 Constipation, unspecified: Secondary | ICD-10-CM | POA: Insufficient documentation

## 2013-10-05 DIAGNOSIS — Z8739 Personal history of other diseases of the musculoskeletal system and connective tissue: Secondary | ICD-10-CM | POA: Insufficient documentation

## 2013-10-05 DIAGNOSIS — Z90710 Acquired absence of both cervix and uterus: Secondary | ICD-10-CM | POA: Insufficient documentation

## 2013-10-05 DIAGNOSIS — IMO0002 Reserved for concepts with insufficient information to code with codable children: Secondary | ICD-10-CM | POA: Insufficient documentation

## 2013-10-05 DIAGNOSIS — K589 Irritable bowel syndrome without diarrhea: Secondary | ICD-10-CM | POA: Insufficient documentation

## 2013-10-05 DIAGNOSIS — Z862 Personal history of diseases of the blood and blood-forming organs and certain disorders involving the immune mechanism: Secondary | ICD-10-CM | POA: Insufficient documentation

## 2013-10-05 LAB — URINALYSIS W MICROSCOPIC + REFLEX CULTURE
Bilirubin Urine: NEGATIVE
Glucose, UA: NEGATIVE mg/dL
Leukocytes, UA: NEGATIVE
Specific Gravity, Urine: 1.012 (ref 1.005–1.030)
pH: 6.5 (ref 5.0–8.0)

## 2013-10-05 LAB — POCT URINALYSIS DIPSTICK
Protein, UA: NEGATIVE
Spec Grav, UA: 1.015
Urobilinogen, UA: 0.2

## 2013-10-05 LAB — CBC WITH DIFFERENTIAL/PLATELET
Basophils Absolute: 0 10*3/uL (ref 0.0–0.1)
Eosinophils Absolute: 0.1 10*3/uL (ref 0.0–0.7)
Eosinophils Relative: 1 % (ref 0–5)
MCH: 29.8 pg (ref 26.0–34.0)
MCV: 86.7 fL (ref 78.0–100.0)
Platelets: 295 10*3/uL (ref 150–400)
RDW: 13 % (ref 11.5–15.5)

## 2013-10-05 LAB — COMPREHENSIVE METABOLIC PANEL
ALT: 13 U/L (ref 0–35)
AST: 19 U/L (ref 0–37)
Calcium: 10.1 mg/dL (ref 8.4–10.5)
GFR calc Af Amer: >90 mL/min (ref >90)
Sodium: 140 mEq/L (ref 135–145)
Total Protein: 7.5 g/dL (ref 6.0–8.3)

## 2013-10-05 MED ORDER — SODIUM CHLORIDE 0.9 % IV BOLUS (SEPSIS)
1000.0000 mL | Freq: Once | INTRAVENOUS | Status: AC
  Administered 2013-10-05: 1000 mL via INTRAVENOUS

## 2013-10-05 MED ORDER — IOHEXOL 300 MG/ML  SOLN
25.0000 mL | INTRAMUSCULAR | Status: AC
  Administered 2013-10-05 (×2): 25 mL via ORAL

## 2013-10-05 NOTE — ED Notes (Signed)
CT called to inquire about scan.  

## 2013-10-05 NOTE — ED Notes (Signed)
Pt c/o right sided lower abd pain into back with some N/V/D x 3 days

## 2013-10-05 NOTE — ED Notes (Signed)
Pt informed RN that she was told she can have her CT scan at 2300 tonight.

## 2013-10-05 NOTE — ED Provider Notes (Signed)
CSN: [id number]     Arrival date & time 10/05/13  1638 History   First MD Initiated Contact with Patient 10/05/13 1944     Chief Complaint  Patient presents with  . Abdominal Pain   (Consider location/radiation/quality/duration/timing/severity/associated sxs/prior Treatment) HPI Comments: Terri Montgomery is a 52 y.o. female who presents emergency department complaining of abdominal pain. Patient states pain for 3 days, states pain is worsening. Patient reports pain in the right lower quadrant radiating into the right flank. Patient was seen by her doctor today, states was told that her urine analysis is normal. She denies any urinary symptoms. She has history of total hysterectomy. She states it feels "full and painful in the right pelvic region." Patient states she has not had any appetite in the last 2 days. She has had nausea and vomiting. States pain is sharp, constant but varies in intensity. Patient denies any fever. Patient was given Bentyl for her pain but states she's not taking it because she doesn't like to take medications. States medications make her constipated. Patient denies any blood in her stool or emesis. States they also checked for blood in her stool today and was negative.   Past Medical History  Diagnosis Date  . Hypertrophic pyloric stenosis   . GERD (gastroesophageal reflux disease)   . Anemia   . Arthritis   . Heart murmur   . PONV (postoperative nausea and vomiting)     HA post anesthesia  . Primary hyperparathyroidism 12/19/2011    Underwent removal of a hyperplastic left superior parathyroid March 2013, with improvement in symptoms   . 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 Abdominal pain, RLQ 09/03/2011    Patient with long history of abdominal pain 1999-2006, previously with pain in RLQ, told she would "need surgery" in [place] before moving to [place] in 2008.  Never had this surgery that had been recommended.   S/p ccy, TAH c BSO for fibromas (no cancer history).   . Headache(784.0)   . Fibromyalgia   . Gallstones   . IBS (irritable bowel syndrome)   . Pneumonia   . Non-celiac gluten sensitivity    Past Surgical History  Procedure Laterality Date  . Cholecystectomy  1998  . Parathyroidectomy  01/21/2012    Procedure: PARATHYROIDECTOMY;  Surgeon: Currie Paris, MD;  Location: [facility] OR;  Service: General;  Laterality: N/A;  Left superior parathyroidectomy  . Breast cyst excision Left   . Abdominal hysterectomy  2002   Family History  Problem Relation Age of Onset  . Heart disease Daughter   . Heart disease Maternal Grandmother   . Heart disease Paternal Grandmother   . Anesthesia problems Neg Hx   . Prostate cancer Father   . Colon polyps Mother   . Colon polyps Brother   . Other Daughter 23    colon obstruction-deceased age 17  . Other Daughter     gluten sensitive  . Ulcerative colitis Mother   . Diabetes Mother   . Diabetes Brother   . Irritable bowel syndrome Mother   . Kidney disease Mother    History  Substance Use Topics  . Smoking status: Never Smoker   . Smokeless tobacco: Never Used  . Alcohol Use: No   OB History   Grav Para Term Preterm Abortions TAB SAB Ect Mult Living                 Review of Systems  Constitutional: Negative for fever and chills.  Respiratory: Negative for cough, chest tightness and shortness of breath.   Cardiovascular: Negative for chest pain, palpitations and leg swelling.  Gastrointestinal: Positive for nausea, vomiting and abdominal pain. Negative for diarrhea.  Genitourinary: Positive for flank pain and pelvic pain. Negative for dysuria and hematuria.  Musculoskeletal: Negative for arthralgias, myalgias, neck pain and neck stiffness.  Skin: Negative for rash.  Neurological: Negative for dizziness, weakness and headaches.  All other systems reviewed and are negative.    Allergies  Demerol; Imitrex; Iodine; Penicillins; Tramadol;  and Morphine and related  Home Medications   Current Outpatient Rx  Name  Route  Sig  Dispense  Refill  . dicyclomine (BENTYL) 10 MG capsule      Take 1 tab 3 times a day as needed for abd pain and cramping.   90 capsule   1   . MAGNESIUM PO   Oral   Take 1 tablet by mouth every other day. Pt does not know the strength, just an OTC vitamin         . Probiotic Product (PROBIOTIC DAILY PO)   Oral   Take 1 capsule by mouth.         . hydrocortisone (ANUSOL-HC) 2.5 % rectal cream   Rectal   Place rectally 4 (four) times daily. Apply rectally a small amount of cream internally and externally .   30 g   1    BP 136/78  Pulse 57  Temp(Src) 98.1 F (36.7 C) (Oral)  Resp 18  Wt 143 lb 1.6 oz (64.91 kg)  SpO2 99% Physical Exam  Nursing note and vitals reviewed. Constitutional: She appears well-developed and well-nourished. No distress.  HENT:  Head: Normocephalic.  Eyes: Conjunctivae are normal.  Neck: Neck supple.  Cardiovascular: Normal rate, regular rhythm and normal heart sounds.   Pulmonary/Chest: Effort normal and breath sounds normal. No respiratory distress. She has no wheezes. She has no rales.  Abdominal: Soft. Bowel sounds are normal. She exhibits no distension. There is tenderness. There is no rebound.  Right lower quadrant tenderness. No guarding. Right CVA tenderness.  Musculoskeletal: She exhibits no edema.  Neurological: She is alert.  Skin: Skin is warm and dry.  Psychiatric: She has a normal mood and affect. Her behavior is normal.    ED Course  Procedures (including critical care time) Labs Review Labs Reviewed  COMPREHENSIVE METABOLIC PANEL - Abnormal; Notable for the following:    Total Bilirubin 0.2 (*)    All other components within normal limits  URINALYSIS W MICROSCOPIC + REFLEX CULTURE - Abnormal; Notable for the following:    Hgb urine dipstick SMALL (*)    Ketones, ur 15 (*)    All other components within normal limits  CBC WITH  DIFFERENTIAL  LIPASE, BLOOD   Imaging Review Ct Abdomen Pelvis Wo Contrast  10/05/2013   CLINICAL DATA:  Right-sided lower abdominal pain radiating to the back with nausea vomiting diarrhea for 3 days.  EXAM: CT ABDOMEN AND PELVIS WITHOUT CONTRAST  TECHNIQUE: Multidetector CT imaging of the abdomen and pelvis was performed following the standard protocol without intravenous contrast.  COMPARISON:  Prior CT from 01/28/2013  FINDINGS: The visualized lung bases are clear.  Scattered hepatic cysts are again noted, unchanged. Liver is otherwise unremarkable. Gallbladder is surgically absent. There is no biliary ductal dilatation. The spleen, right adrenal glands, and pancreas demonstrate a normal contrast enhanced appearance. A 1 cm hypodense lesion measuring near fat density arising  from the lateral limb of the left adrenal gland is most consistent with a benign adenoma, and is unchanged as compared to the prior study.  The kidneys are symmetric in size without evidence of nephrolithiasis or hydronephrosis. No stones are seen along the course of either renal collecting system.  There is no evidence of bowel obstruction. Enteric contrast material reaches the level the rectum. Appendix is well visualized in the right lower quadrant and is of normal caliber and appearance without associated inflammatory changes to suggest acute appendicitis. No abnormal wall thickening or inflammatory fat stranding seen about the bowels.  Bladder is within normal limits. Uterus and ovaries are not visualized.  No free air or fluid. No pathologically enlarged intra-abdominal pelvic lymph nodes are identified.  No acute osseous abnormalities identified. No focal lytic or blastic osseous lesions are seen.  IMPRESSION: 1. No CT evidence of nephrolithiasis or obstructive uropathy identified. 2. No acute intra-abdominal or pelvic process identified.   Electronically Signed   By: Rise Mu M.D.   On: 10/05/2013 23:46    EKG  Interpretation   None       MDM   1. Abdominal pain     Patient with right flank and right lower abdominal pain for 3 days. Patient appears to be in a lot of pain but otherwise nontoxic. She is afebrile. UA is negative at doctor's office. Also states Hemoccult negative today. Given history and exam findings I suspect patient may have a right-sided kidney stone versus appendicitis. Will get labs, UA, CT abdomen pelvis. Patient does have IV contrast allergy so will do with by mouth contrast only. Patient appears to be in a lot of pain however she is refusing any pain medications at this time.  12:14 AM CT negative. Pt's VS normal. Abdomen continues to be tender in RLQ. Normal WBC. Do not think acute abdomen at this time. Will d/c home with bentyl, zofran, norco. Follow up with PCP.   Filed Vitals:   10/05/13 2026 10/05/13 2208 10/05/13 2230 10/05/13 2336  BP: 136/78 137/92 127/64 113/67  Pulse: 57 57 57 63  Temp: 98.1 F (36.7 C)     TempSrc: Oral     Resp: 18 16 14 16   Weight:      SpO2: 99% 99% 100% 99%      Vincenzina Jagoda A Deryl Ports, PA-C 10/06/13 0020

## 2013-10-05 NOTE — ED Notes (Signed)
Pt requesting tylenol for pain

## 2013-10-05 NOTE — Progress Notes (Signed)
  Subjective:    Patient ID: Terri Montgomery, female    DOB: May 22, 1961, 52 y.o.   MRN: [id number]  HPI  52 year old F with IBS, fibromyalgia, presenting with back and abdominal pain. The pain is in her right lower abdominal area. It is sharp. It does not radiate. Her last bowel movement was yesterday and was soft and watery. Today she feels the urge to have a bowel movement but cannot. Associated complaints include nausea, subjective fever, decreased appetite. Overall, the course is worsening.    PMH - Patient with IBS. She was evaluated in March 2014 for RLQ pain and rectal bleeding. A colonoscopy was performed and was normal. She was prescribed bentyl for spasms. However, she does not taking it.   PSH - Total Abdominal Hysterectomy for fibroid in 1998; cholecystectomy 1996  Review of Systems See HPI    Objective:   Physical Exam BP 135/88  Temp(Src) 98 F (36.7 C) (Oral)  Ht 5\' 2"  (1.575 m)  Wt 143 lb 8 oz (65.091 kg)  BMI 26.24 kg/m2  Gen: middle-aged female, mildly distressed Mouth: OP clear and moist Cardiovascular: regular rate and rhythm, no murmurs Lungs: normal work of breathing, clear to auscultation bilaterally Abdomen: soft, tenderness to deep palpation in the right lower quadrant without guarding or rebound, no peritoneal signs Rectal exam: large external hemorrhoid on right, nonthrombosed, No palpable stool in rectal vault , Hemoccult negative  Anoscopy: no visible internal hemorrhoids or fissures   The patient declined pain medication and nausea medication    Assessment & Plan:  52 year old female with right lower quadrant pain that is consistent with 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 Her exam is noteworthy for tenderness to deep palpation over McBurney's point, but there is no evidence of peritonitis. I have concern for possible appendicitis. I would like to have the patient evaluated with right lower quadrant ultrasound, but the radiology  department said that in order to rule out appendicitis a CT scan with and without contrast is necessary. Given the patient's worsening pain and the potential delay ordering a nonurgent CT scan, I encouraged the patient to go to the emergency department for further evaluation. The patient and her daughter are agreeable to this plan and will go to the emergency department at this time. The patient is hemodynamically stable and does not need the assistance of care length.

## 2013-10-05 NOTE — Patient Instructions (Signed)
Dear Mrs. Terri Montgomery,   I am sorry about your abdominal pain. This could be a flare of your IBS or it could be inflammation of the appendix. I will need to get an ultrasound to make sure that is not ok.   Also, you should continue to take nausea medication and drink plenty of water at home. Also, take the medication called Bentyl that your GI doctor gave you.   Sincerely,   Dr. Clinton Sawyer

## 2013-10-06 ENCOUNTER — Encounter: Payer: Self-pay | Admitting: Family Medicine

## 2013-10-06 DIAGNOSIS — R319 Hematuria, unspecified: Secondary | ICD-10-CM | POA: Insufficient documentation

## 2013-10-06 MED ORDER — ONDANSETRON 4 MG PO TBDP: 4.0000 mg | ORAL_TABLET | Freq: Three times a day (TID) | ORAL | Status: DC | PRN

## 2013-10-06 MED ORDER — HYDROCODONE-ACETAMINOPHEN 5-325 MG PO TABS: 1.0000 | ORAL_TABLET | Freq: Four times a day (QID) | ORAL | Status: DC | PRN

## 2013-10-06 MED ORDER — DICYCLOMINE HCL 20 MG PO TABS: 20.0000 mg | ORAL_TABLET | Freq: Two times a day (BID) | ORAL | Status: DC

## 2013-10-07 NOTE — ED Provider Notes (Signed)
Medical screening examination/treatment/procedure(s) were performed by non-physician practitioner and as supervising physician I was immediately available for consultation/collaboration.     Geoffery Lyons, MD 10/07/13 [phone]

## 2013-10-18 ENCOUNTER — Ambulatory Visit
Admission: RE | Admit: 2013-10-18 | Discharge: 2013-10-18 | Disposition: A | Discharge status: Home / Self Care | Payer: Commercial Managed Care - PPO | Source: Ambulatory Visit | Attending: Family Medicine | Admitting: Family Medicine

## 2013-10-18 ENCOUNTER — Encounter: Payer: Self-pay | Admitting: Sports Medicine

## 2013-10-18 ENCOUNTER — Ambulatory Visit (INDEPENDENT_AMBULATORY_CARE_PROVIDER_SITE_OTHER): Payer: Commercial Managed Care - PPO | Admitting: Sports Medicine

## 2013-10-18 VITALS — BP 141/75 | HR 63 | Temp 98.1°F | Ht 62.0 in | Wt 143.1 lb

## 2013-10-18 DIAGNOSIS — R05 Cough: Secondary | ICD-10-CM

## 2013-10-18 DIAGNOSIS — R059 Cough, unspecified: Secondary | ICD-10-CM

## 2013-10-18 MED ORDER — AZITHROMYCIN 250 MG PO TABS: ORAL_TABLET | ORAL | Status: DC

## 2013-10-18 MED ORDER — BENZONATATE 200 MG PO CAPS
200.0000 mg | ORAL_CAPSULE | Freq: Two times a day (BID) | ORAL | Status: DC | PRN
Start: 2013-10-18 — End: 2013-11-25

## 2013-10-18 NOTE — Progress Notes (Signed)
  Terri Montgomery - 52 y.o. female MRN [id number]  Date of birth: 1961-03-27  CC, HPI, INTERVAL HISTORY & ROS  Terri Montgomery is here today for cough and congestion - Terri Montgomery Mother   She reports approximately 2 weeks of overall feeling down.  She was seen in the emergency department about that time for right lower quadrant pain.  Since then she has continued to feel fatigued.  She is started to develop a cough and fevers and chills over the last 4 days.  She's had difficulty sleeping due to cough.  Reports some pleuritic chest pain but no chest pressure or exertional dyspnea.  No tachypalpitations or orthostasis.  Now productive cough, no wheezing  She has tried ibuprofen without significant relief.  Subjective fevers and chills without objective confirmation.  No other sick contacts other than was in the emergency department.  Reports a history of nasal congestion with ear congestion and slightly diminished hearing on the left.   Positive myalgias  No longer having diarrhea, no vomiting.  History  Past Medical, Surgical, Social, and Family History Reviewed per EMR Medications and Allergies reviewed and all updated if necessary. Objective Findings  VITALS: HR: 63 bpm  BP: 141/75 mmHg  TEMP: 98.1 F (36.7 C) (Oral)  RESP:    HT: 5\' 2"  (157.5 cm)  WT: 143 lb 1.6 oz (64.91 kg)  BMI: 26.2   BP Readings from Last 3 Encounters:  10/18/13 141/75  10/06/13 111/65  10/05/13 135/88   Wt Readings from Last 3 Encounters:  10/18/13 143 lb 1.6 oz (64.91 kg)  10/05/13 143 lb 1.6 oz (64.91 kg)  10/05/13 143 lb 8 oz (65.091 kg)     PHYSICAL EXAM: GENERAL:  adult female. In no discomfort; no respiratory distress  PSYCH: alert and appropriate, good insight   HNEENT: H&N: AT/[place], trachea midline  Eyes: no scleral icterus, no conjunctival exudate  Ears:  tympanic membranes pearly gray, left-sided effusion.   Nose:  nasal congestion   Oropharynx: MMM, posterior oropharyngeal erythema  without exudate   Dentention:  normal     CARDIO: RRR, S1/S2 heard, no murmur  LUNGS:  coarse breath sounds diffusely, crackles in the bilateral bases.    ABDOMEN:   EXTREM:  Warm, well perfused.  Moves all 4 extremities spontaneously; no lateralization.  No noted foot lesions.  Distal pulses 1+/4.  no pretibial edema.  GU:   SKIN:  no rashes     Assessment & Plan   Problems addressed today: General Plan & Pt Instructions:  1. Cough      Symptomatic relief as below Go get a chest X-ray I will call in an antibiotic once I see your Chest X-ray     For further discussion of A/P and for follow up issues see problem based charting if applicable.

## 2013-10-18 NOTE — Patient Instructions (Signed)
Symptomatic relief as below Go get a chest X-ray I will call in an antibiotic once I see your Chest X-ray   If you need anything prior to your next visit please call the clinic. Please Bring all medications or accurate medication list with you to each appointment; an accurate medication list is essential in providing you the best care possible.     Please continue to drink plenty of fluids Take acetaminophen (Tylenol) 1X500mg  or 2X325mg  tablets every 8 hours  Take Ibuprofen (Advil or Motrin) 2-3 200mg  tablets every 8 hours  Try to alternate the acetaminophen and ibuprofen so that you take one every 4 hours Honey has been shown to help with cough.  You can try mixing  a teaspoon of honey in warm water or caffeine free herbal tea before bedtime.   Bronchitis Bronchitis is the body's way of reacting to injury and/or infection (inflammation) of the bronchi. Bronchi are the air tubes that extend from the windpipe into the lungs. If the inflammation becomes severe, it may cause shortness of breath. CAUSES  Inflammation may be caused by:  A virus.  Germs (bacteria).  Dust.  Allergens.  Pollutants and many other irritants. The cells lining the bronchial tree are covered with tiny hairs (cilia). These constantly beat upward, away from the lungs, toward the mouth. This keeps the lungs free of pollutants. When these cells become too irritated and are unable to do their job, mucus begins to develop. This causes the characteristic cough of bronchitis. The cough clears the lungs when the cilia are unable to do their job. Without either of these protective mechanisms, the mucus would settle in the lungs. Then you would develop pneumonia. Smoking is a common cause of bronchitis and can contribute to pneumonia. Stopping this habit is the single most important thing you can do to help yourself. TREATMENT   Your caregiver may prescribe an antibiotic if the cough is caused by bacteria. Also, medicines  that open up your airways make it easier to breathe. Your caregiver may also recommend or prescribe an expectorant. It will loosen the mucus to be coughed up. Only take over-the-counter or prescription medicines for pain, discomfort, or fever as directed by your caregiver.  Removing whatever causes the problem (smoking, for example) is critical to preventing the problem from getting worse.  Cough suppressants may be prescribed for relief of cough symptoms.  Inhaled medicines may be prescribed to help with symptoms now and to help prevent problems from returning.  For those with recurrent (chronic) bronchitis, there may be a need for steroid medicines. SEEK IMMEDIATE MEDICAL CARE IF:   During treatment, you develop more pus-like mucus (purulent sputum).  You have a fever.  You become progressively more ill.  You have increased difficulty breathing, wheezing, or shortness of breath. It is necessary to seek immediate medical care if you are elderly or sick from any other disease. MAKE SURE YOU:   Understand these instructions.  Will watch your condition.  Will get help right away if you are not doing well or get worse. Document Released: 10/28/2005 Document Revised: 06/30/2013 Document Reviewed: 06/22/2013 [facility] Patient Information 2014 [place], [place].

## 2013-10-18 NOTE — Assessment & Plan Note (Signed)
Unclear etiology.  Most likely viral but given duration of symptoms and lack of x-ray findings we'll treat for acute bronchitis with azithromycin.  Encourage symptomatically with Tylenol and Motrin.  This could be a post flu syndrome given the duration and body aches.  She did not receive her flu shot this year

## 2013-11-25 ENCOUNTER — Encounter: Payer: Self-pay | Admitting: Family Medicine

## 2013-11-25 ENCOUNTER — Ambulatory Visit (INDEPENDENT_AMBULATORY_CARE_PROVIDER_SITE_OTHER): Payer: Commercial Managed Care - PPO | Admitting: Family Medicine

## 2013-11-25 VITALS — BP 134/79 | HR 63 | Temp 98.2°F | Wt 140.0 lb

## 2013-11-25 DIAGNOSIS — J029 Acute pharyngitis, unspecified: Secondary | ICD-10-CM

## 2013-11-25 DIAGNOSIS — M791 Myalgia, unspecified site: Secondary | ICD-10-CM

## 2013-11-25 DIAGNOSIS — IMO0001 Reserved for inherently not codable concepts without codable children: Secondary | ICD-10-CM

## 2013-11-25 LAB — POCT RAPID STREP A (OFFICE): Rapid Strep A Screen: NEGATIVE

## 2013-11-25 MED ORDER — VENLAFAXINE HCL ER 37.5 MG PO CP24: 37.5000 mg | ORAL_CAPSULE | Freq: Every day | ORAL | Status: DC

## 2013-11-25 MED ORDER — IBUPROFEN 600 MG PO TABS: 600.0000 mg | ORAL_TABLET | Freq: Four times a day (QID) | ORAL | Status: DC | PRN

## 2013-11-25 NOTE — Progress Notes (Signed)
   Subjective:    Patient ID: Terri Montgomery, female    DOB: Dec 20, 1960, 53 y.o.   MRN: [id number]  Sore Throat  This is a new problem. The current episode started in the past 7 days. The problem has been gradually worsening. There has been no fever. The pain is mild. Associated symptoms include neck pain. Pertinent negatives include no abdominal pain, congestion or shortness of breath. She has had no exposure to strep or mono. She has tried acetaminophen for the symptoms. The treatment provided mild relief.   Has been seen for this in the past.  Also, continues with constant complaint of myalgias.  Noted in multiple areas of the chart.  Has h/o ? Fibromyalgia noted previously.  On no meds for this right now. Has tried Ultram and narcotics in the past.   Review of Systems  Constitutional: Negative for fever and chills.  HENT: Negative for congestion.   Respiratory: Negative for shortness of breath.   Cardiovascular: Negative for leg swelling.  Gastrointestinal: Negative for abdominal pain.  Musculoskeletal: Positive for myalgias and neck pain.       Objective:   Physical Exam  Vitals reviewed. Constitutional: She appears well-developed and well-nourished.  HENT:  Head: Normocephalic and atraumatic.  Mouth/Throat: Oropharynx is clear and moist. No oropharyngeal exudate.  Mild erythema  Eyes: No scleral icterus.  Neck: Neck supple.  Cardiovascular: Normal rate.   Pulmonary/Chest: Effort normal.  Abdominal: Soft. There is no tenderness.  Lymphadenopathy:    She has no cervical adenopathy.  Neurological: She is alert.  Skin: Skin is warm and dry.      Rapid strep A negative.    Assessment & Plan:

## 2013-11-25 NOTE — Patient Instructions (Addendum)
Sore Throat A sore throat is pain, burning, irritation, or scratchiness of the throat. There is often pain or tenderness when swallowing or talking. A sore throat may be accompanied by other symptoms, such as coughing, sneezing, fever, and swollen neck glands. A sore throat is often the first sign of another sickness, such as a cold, flu, strep throat, or mononucleosis (commonly known as mono). Most sore throats go away without medical treatment. CAUSES  The most common causes of a sore throat include:  A viral infection, such as a cold, flu, or mono.  A bacterial infection, such as strep throat, tonsillitis, or whooping cough.  Seasonal allergies.  Dryness in the air.  Irritants, such as smoke or pollution.  Gastroesophageal reflux disease (GERD). HOME CARE INSTRUCTIONS   Only take over-the-counter medicines as directed by your caregiver.  Drink enough fluids to keep your urine clear or pale yellow.  Rest as needed.  Try using throat sprays, lozenges, or sucking on hard candy to ease any pain (if older than 4 years or as directed).  Sip warm liquids, such as broth, herbal tea, or warm water with honey to relieve pain temporarily. You may also eat or drink cold or frozen liquids such as frozen ice pops.  Gargle with salt water (mix 1 tsp salt with 8 oz of water).  Do not smoke and avoid secondhand smoke.  Put a cool-mist humidifier in your bedroom at night to moisten the air. You can also turn on a hot shower and sit in the bathroom with the door closed for 5 10 minutes. SEEK IMMEDIATE MEDICAL CARE IF:  You have difficulty breathing.  You are unable to swallow fluids, soft foods, or your saliva.  You have increased swelling in the throat.  Your sore throat does not get better in 7 days.  You have nausea and vomiting.  You have a fever or persistent symptoms for more than 2 3 days.  You have a fever and your symptoms suddenly get worse. MAKE SURE YOU:   Understand  these instructions.  Will watch your condition.  Will get help right away if you are not doing well or get worse. Document Released: 12/05/2004 Document Revised: 10/14/2012 Document Reviewed: 07/05/2012 [facility] Patient Information 2014 [place], [place]. Fibromyalgia Fibromyalgia is a disorder that is often misunderstood. It is associated with muscular pains and tenderness that comes and goes. It is often associated with fatigue and sleep disturbances. Though it tends to be long-lasting, fibromyalgia is not life-threatening. CAUSES  The exact cause of fibromyalgia is unknown. People with certain gene types are predisposed to developing fibromyalgia and other conditions. Certain factors can play a role as triggers, such as:  Spine disorders.  Arthritis.  Severe injury (trauma) and other physical stressors.  Emotional stressors. SYMPTOMS   The main symptom is pain and stiffness in the muscles and joints, which can vary over time.  Sleep and fatigue problems. Other related symptoms may include:  Bowel and bladder problems.  Headaches.  Visual problems.  Problems with odors and noises.  Depression or mood changes.  Painful periods (dysmenorrhea).  Dryness of the skin or eyes. DIAGNOSIS  There are no specific tests for diagnosing fibromyalgia. Patients can be diagnosed accurately from the specific symptoms they have. The diagnosis is made by determining that nothing else is causing the problems. TREATMENT  There is no cure. Management includes medicines and an active, healthy lifestyle. The goal is to enhance physical fitness, decrease pain, and improve sleep. HOME CARE INSTRUCTIONS  Only take over-the-counter or prescription medicines as directed by your caregiver. Sleeping pills, tranquilizers, and pain medicines may make your problems worse.  Low-impact aerobic exercise is very important and advised for treatment. At first, it may seem to make pain worse. Gradually  increasing your tolerance will overcome this feeling.  Learning relaxation techniques and how to control stress will help you. Biofeedback, visual imagery, hypnosis, muscle relaxation, yoga, and meditation are all options.  Anti-inflammatory medicines and physical therapy may provide short-term help.  Acupuncture or massage treatments may help.  Take muscle relaxant medicines as suggested by your caregiver.  Avoid stressful situations.  Plan a healthy lifestyle. This includes your diet, sleep, rest, exercise, and friends.  Find and practice a hobby you enjoy.  Join a fibromyalgia support group for interaction, ideas, and sharing advice. This may be helpful. SEEK MEDICAL CARE IF:  You are not having good results or improvement from your treatment. FOR MORE INFORMATION  National Fibromyalgia Association: www.fmaware.[place]: www.arthritis.org Document Released: 10/28/2005 Document Revised: 01/20/2012 Document Reviewed: 02/07/2010 [facility] Patient Information 2014 [place], [place].

## 2013-11-25 NOTE — Assessment & Plan Note (Signed)
Trial of warm salt water gargles, ibuprofen, tea.

## 2013-11-26 NOTE — Assessment & Plan Note (Signed)
?   Fibromyalgia--trial of Effexor.

## 2014-01-21 ENCOUNTER — Ambulatory Visit: Payer: Commercial Managed Care - PPO | Admitting: Family Medicine

## 2014-03-30 ENCOUNTER — Other Ambulatory Visit: Payer: Self-pay

## 2014-03-30 DIAGNOSIS — Z1231 Encounter for screening mammogram for malignant neoplasm of breast: Secondary | ICD-10-CM

## 2014-04-01 ENCOUNTER — Encounter (INDEPENDENT_AMBULATORY_CARE_PROVIDER_SITE_OTHER): Payer: Self-pay

## 2014-04-01 ENCOUNTER — Ambulatory Visit: Payer: Commercial Managed Care - PPO

## 2014-04-01 ENCOUNTER — Ambulatory Visit
Admission: RE | Admit: 2014-04-01 | Discharge: 2014-04-01 | Disposition: A | Discharge status: Home / Self Care | Payer: Commercial Managed Care - PPO | Source: Ambulatory Visit

## 2014-04-01 DIAGNOSIS — Z1231 Encounter for screening mammogram for malignant neoplasm of breast: Secondary | ICD-10-CM

## 2014-05-20 ENCOUNTER — Ambulatory Visit: Payer: Commercial Managed Care - PPO | Admitting: Family Medicine

## 2014-07-20 ENCOUNTER — Encounter: Payer: Self-pay | Admitting: Endocrinology

## 2014-07-20 ENCOUNTER — Ambulatory Visit (INDEPENDENT_AMBULATORY_CARE_PROVIDER_SITE_OTHER): Payer: Commercial Managed Care - PPO | Admitting: Endocrinology

## 2014-07-20 VITALS — BP 121/73 | HR 66 | Temp 97.6°F | Resp 14 | Ht 62.0 in | Wt 148.0 lb

## 2014-07-20 DIAGNOSIS — R5383 Other fatigue: Secondary | ICD-10-CM

## 2014-07-20 DIAGNOSIS — E213 Hyperparathyroidism, unspecified: Secondary | ICD-10-CM

## 2014-07-20 DIAGNOSIS — R61 Generalized hyperhidrosis: Secondary | ICD-10-CM

## 2014-07-20 DIAGNOSIS — E559 Vitamin D deficiency, unspecified: Secondary | ICD-10-CM

## 2014-07-20 DIAGNOSIS — R5381 Other malaise: Secondary | ICD-10-CM

## 2014-07-20 LAB — RENAL FUNCTION PANEL
ALBUMIN: 4.5 g/dL (ref 3.5–5.2)
BUN: 14 mg/dL (ref 6–23)
CALCIUM: 10.1 mg/dL (ref 8.4–10.5)
CHLORIDE: 104 meq/L (ref 96–112)
CO2: 29 meq/L (ref 19–32)
CREATININE: 0.7 mg/dL (ref 0.4–1.2)
GFR: 95.77 mL/min (ref >60.00)
Glucose, Bld: 84 mg/dL (ref 70–99)
Phosphorus: 3 mg/dL (ref 2.3–4.6)
Potassium: 4 mEq/L (ref 3.5–5.1)
Sodium: 139 mEq/L (ref 135–145)

## 2014-07-20 NOTE — Patient Instructions (Signed)
Stop Vitamin D

## 2014-07-20 NOTE — Progress Notes (Addendum)
Patient ID: Terri Montgomery, female   DOB: 1961/07/10, 53 y.o.   MRN: [id number]    Chief complaint: High calcium  History of Present Illness:  Referring physician: Drema Dallas  She has had hypercalcemia since at least 2012 when her level was 11.4 She had been complaining of feeling tired for several years and also hurting all over She was referred for surgery although not clear if she had any evaluation for bone disease and had no history of nephrolithiasis.  On her parathyroid scan she had a possible left upper normal and this was removed through minimally invasive surgery in 2013 Calcium after surgery was down to 10.3 compared to 11.2 before the surgery  However she did not feel subjectively any better with her fatigue and generalized aches Her calcium had continued to be high normal since her surgery and in 8/15 with her PCP was increased at 11.0 PTH level recently was 46 She also had a vitamin D level of 20.7  She continues to complain about feeling tired and also hurting all over. She was started on vitamin D supplements about 3 weeks ago and she did she may be a little less tired  Bone density: Has not had this since she was in [place] several years ago and results not available. She has not been told to have osteoporosis She has not been on HRT except for a few years after her surgical menopause at age 43   Lab Results  Component Value Date   CALCIUM 10.1 07/20/2014   CALCIUM 10.1 10/05/2013   CALCIUM 10.3 02/02/2013   CALCIUM 10.1 01/18/2013   CALCIUM 10.8* 09/25/2012   CALCIUM 10.0 06/23/2012   CALCIUM 9.7 04/01/2012   CALCIUM 10.1 03/31/2012   CALCIUM 10.3 03/30/2012    Lab Results  Component Value Date   PTH 272.2* 12/02/2011   CALCIUM 10.1 07/20/2014   PHOS 3.0 07/20/2014          Medication List       This list is accurate as of: 07/20/14 11:59 PM.  Always use your most recent med list.               ergocalciferol 50000 UNITS capsule  Commonly known as:  VITAMIN  D2  Take 50,000 Units by mouth once a week.     ibuprofen 600 MG tablet  Commonly known as:  ADVIL,MOTRIN  Take 1 tablet (600 mg total) by mouth every 6 (six) hours as needed.        Allergies:  Allergies  Allergen Reactions  . Demerol Shortness Of Breath    SOB rash   . Imitrex [Sumatriptan Base] Shortness Of Breath, Rash and Other (See Comments)    Increases HA  . Iodine Shortness Of Breath    Pt states feels like having a heart attack   . Penicillins Shortness Of Breath  . Tramadol     Headaches and shortness of breath  . Morphine And Related Nausea And Vomiting and Rash    In low doses patient did not experience negative side effects. Rash     Past Medical History  Diagnosis Date  . Hypertrophic pyloric stenosis   . GERD (gastroesophageal reflux disease)   . Anemia   . Arthritis   . Heart murmur   . PONV (postoperative nausea and vomiting)     HA post anesthesia  . Primary hyperparathyroidism 12/19/2011    Underwent removal of a hyperplastic left superior parathyroid March 2013, with improvement in symptoms   . Mastodynia,  female 10/23/2011    Bilateral; worse on L side (Left outer inferior quadrant)   . Abdominal pain, RLQ 09/03/2011    Patient with long history of abdominal pain 1999-2006, previously with pain in RLQ, told she would "need surgery" in [place] before moving to [place] in 2008.  Never had this surgery that had been recommended.  S/p ccy, TAH c BSO for fibromas (no cancer history).   . Headache(784.0)   . Fibromyalgia   . Gallstones   . IBS (irritable bowel syndrome)   . Pneumonia   . Non-celiac gluten sensitivity     Mom osteo     Past Surgical History  Procedure Laterality Date  . Cholecystectomy  1998  . Parathyroidectomy  01/21/2012    Procedure: PARATHYROIDECTOMY;  Surgeon: Haywood Lasso, MD;  Location: [facility];  Service: General;  Laterality: N/A;  Left superior parathyroidectomy  . Breast cyst excision Left   . Abdominal  hysterectomy  2002    Family History  Problem Relation Age of Onset  . Heart disease Daughter   . Heart disease Maternal Grandmother   . Heart disease Paternal Grandmother   . Anesthesia problems Neg Hx   . Prostate cancer Father   . Colon polyps Mother   . Colon polyps Brother   . Other Daughter 76    colon obstruction-deceased age 96  . Other Daughter     gluten sensitive  . Ulcerative colitis Mother   . Diabetes Mother   . Diabetes Brother   . Irritable bowel syndrome Mother   . Kidney disease Mother     Social History:  reports that she has never smoked. She has never used smokeless tobacco. She reports that she does not drink alcohol or use illicit drugs.  ROS   She has been having generalized aching in her muscles or joints for several years. She had previously evaluated in [place] and had negative rheumatological studies, was told that she may have fibromyalgia She has recently seen another rheumatologist Dr Dossie Der and is awaiting test results  She complains of fatigue for several years She thinks this may be about 20% better with starting high-dose vitamin D from PCP  She is concerned about her progressive weight gain. She thinks this is despite fairly good diet and trying to walk regularly  He has not had any history of hypertension  Occasionally may feel some palpitations  She complains of her chest hurting periodically and has discussed this with PCP  She will get headaches off and on  Surgical menopause  age 36, HRT  given for 5-6  years but stopped because of development of Cysts in  Breasts She is having difficulty with hot flashes and sweating spells  Insomnia Present, does not think she is depressed   no swelling of the feet   EXAM:  BP 121/73  Pulse 66  Temp(Src) 97.6 F (36.4 C)  Resp 14  Ht 5\' 2"  (1.575 m)  Wt 148 lb (67.132 kg)  BMI 27.06 kg/m2  SpO2 99%  GENERAL: Averagely built and nourished  No pallor, clubbing, lymphadenopathy  or edema.  Skin:  no rash or pigmentation.  EYES:  Externally normal.    ENT: Oral mucosa and tongue normal.  THYROID:  Not palpable.  HEART:  Normal  S1 and S2; no murmur or click.  CHEST:  Normal shape Lungs:   Vescicular breath sounds heard equally.  No crepitations/ wheeze.  ABDOMEN:  No distention.  Liver and spleen not palpable.  No other mass or tenderness.  NEUROLOGICAL: .Reflexes are normal bilaterally at biceps.  SPINE AND JOINTS: She has tenderness of her trapezius, biceps and anterior thigh muscles. 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 No recent bone density report available and this will be scheduled She can be treated for osteopenia or osteoporosis appropriately  She has not had any history of nephrolithiasis.   VITAMIN D deficiency: This appears to be relatively mild with her vitamin D level of 20.7  Because of her history of  hypercalcemia would like to check her 1, 25 hydroxy vitamin D level before having her continue high-dose vitamin D supplements   FIBROMYALGIA: She will need to get treatment with her rheumatologist as this appears to be her major problem  Hot flashes and sweating: This is likely to be menopausal related. Advised her to consider clonidine at bedtime for relief of nocturnal symptoms but she is reluctant to do this. Also reluctant to consider HRT because of her history of breast cysts  Terri Montgomery 07/21/2014, 5:15 PM   Addendum: Calcium is normal, active vitamin D is high  Message to patient: Calcium level is normal. The active vitamin D level is high and would recommend not taking any vitamin D supplement at the moment. She can take a maintenance dose of 1000 units vitamin D 3 daily OTC starting next month   Office Visit on 07/20/2014  Component Date Value Ref Range Status  . Vit D, 1,25-Dihydroxy 07/20/2014 92.8* 19.9 - 79.3 pg/mL Final  . Sodium 07/20/2014 139  135 - 145 mEq/L Final  . Potassium 07/20/2014 4.0  3.5 - 5.1 mEq/L Final  . Chloride 07/20/2014 104  96 - 112 mEq/L Final  . CO2 07/20/2014 29  19 - 32 mEq/L Final  . Calcium 07/20/2014 10.1  8.4 - 10.5 mg/dL Final  . Albumin 07/20/2014 4.5  3.5 - 5.2 g/dL Final  . BUN 07/20/2014 14  6 - 23 mg/dL Final  . Creatinine, Ser 07/20/2014 0.7  0.4 - 1.2 mg/dL Final  . Glucose, Bld 07/20/2014 84  70 - 99 mg/dL Final  . Phosphorus 07/20/2014 3.0  2.3 - 4.6 mg/dL Final  . GFR 07/20/2014 95.77  >60.00 mL/min Final

## 2014-07-21 LAB — VITAMIN D 1,25 DIHYDROXY: Vit D, 1,25-Dihydroxy: 92.8 pg/mL — ABNORMAL HIGH (ref 19.9–79.3)

## 2014-07-21 NOTE — Progress Notes (Signed)
Quick Note:  Calcium level is normal. The active vitamin D level is high and would recommend not taking any vitamin D supplement at the moment. She can take a maintenance dose of 1000 units vitamin D 3 daily OTC starting next month ______

## 2014-07-25 ENCOUNTER — Encounter: Payer: Self-pay | Admitting: *Deleted

## 2014-08-08 ENCOUNTER — Other Ambulatory Visit: Payer: Commercial Managed Care - PPO

## 2014-08-15 ENCOUNTER — Ambulatory Visit
Admission: RE | Admit: 2014-08-15 | Discharge: 2014-08-15 | Disposition: A | Discharge status: Home / Self Care | Payer: Commercial Managed Care - PPO | Source: Ambulatory Visit | Attending: Endocrinology | Admitting: Endocrinology

## 2014-08-15 DIAGNOSIS — E213 Hyperparathyroidism, unspecified: Secondary | ICD-10-CM

## 2014-10-12 ENCOUNTER — Other Ambulatory Visit: Payer: Commercial Managed Care - PPO

## 2014-10-17 ENCOUNTER — Ambulatory Visit: Payer: Commercial Managed Care - PPO | Admitting: Endocrinology

## 2014-10-27 ENCOUNTER — Emergency Department (HOSPITAL_COMMUNITY): Payer: Commercial Managed Care - PPO

## 2014-10-27 ENCOUNTER — Encounter (HOSPITAL_COMMUNITY): Payer: Self-pay | Admitting: Emergency Medicine

## 2014-10-27 ENCOUNTER — Emergency Department (HOSPITAL_COMMUNITY)
Admission: EM | Admit: 2014-10-27 | Discharge: 2014-10-28 | Disposition: A | Discharge status: Home / Self Care | Payer: Commercial Managed Care - PPO | Attending: Emergency Medicine | Admitting: Emergency Medicine

## 2014-10-27 DIAGNOSIS — Z79899 Other long term (current) drug therapy: Secondary | ICD-10-CM | POA: Diagnosis not present

## 2014-10-27 DIAGNOSIS — Z90710 Acquired absence of both cervix and uterus: Secondary | ICD-10-CM | POA: Insufficient documentation

## 2014-10-27 DIAGNOSIS — R110 Nausea: Secondary | ICD-10-CM | POA: Insufficient documentation

## 2014-10-27 DIAGNOSIS — Z8719 Personal history of other diseases of the digestive system: Secondary | ICD-10-CM | POA: Insufficient documentation

## 2014-10-27 DIAGNOSIS — Z91018 Allergy to other foods: Secondary | ICD-10-CM | POA: Diagnosis not present

## 2014-10-27 DIAGNOSIS — R1031 Right lower quadrant pain: Secondary | ICD-10-CM | POA: Diagnosis not present

## 2014-10-27 DIAGNOSIS — Z9049 Acquired absence of other specified parts of digestive tract: Secondary | ICD-10-CM | POA: Insufficient documentation

## 2014-10-27 DIAGNOSIS — Z8639 Personal history of other endocrine, nutritional and metabolic disease: Secondary | ICD-10-CM | POA: Insufficient documentation

## 2014-10-27 DIAGNOSIS — Z862 Personal history of diseases of the blood and blood-forming organs and certain disorders involving the immune mechanism: Secondary | ICD-10-CM | POA: Diagnosis not present

## 2014-10-27 DIAGNOSIS — R1084 Generalized abdominal pain: Secondary | ICD-10-CM | POA: Diagnosis present

## 2014-10-27 DIAGNOSIS — M1990 Unspecified osteoarthritis, unspecified site: Secondary | ICD-10-CM | POA: Insufficient documentation

## 2014-10-27 DIAGNOSIS — R109 Unspecified abdominal pain: Secondary | ICD-10-CM

## 2014-10-27 DIAGNOSIS — Z880 Allergy status to penicillin: Secondary | ICD-10-CM | POA: Diagnosis not present

## 2014-10-27 DIAGNOSIS — R011 Cardiac murmur, unspecified: Secondary | ICD-10-CM | POA: Insufficient documentation

## 2014-10-27 DIAGNOSIS — Z8701 Personal history of pneumonia (recurrent): Secondary | ICD-10-CM | POA: Diagnosis not present

## 2014-10-27 LAB — CBC WITH DIFFERENTIAL/PLATELET
BASOS ABS: 0 10*3/uL (ref 0.0–0.1)
BASOS PCT: 0 % (ref 0–1)
EOS PCT: 1 % (ref 0–5)
Eosinophils Absolute: 0.1 10*3/uL (ref 0.0–0.7)
HCT: 38.2 % (ref 36.0–46.0)
HEMOGLOBIN: 12.9 g/dL (ref 12.0–15.0)
LYMPHS PCT: 49 % — AB (ref 12–46)
Lymphs Abs: 3.9 10*3/uL (ref 0.7–4.0)
MCH: 29.3 pg (ref 26.0–34.0)
MCHC: 33.8 g/dL (ref 30.0–36.0)
MCV: 86.6 fL (ref 78.0–100.0)
MONOS PCT: 6 % (ref 3–12)
Monocytes Absolute: 0.5 10*3/uL (ref 0.1–1.0)
NEUTROS ABS: 3.5 10*3/uL (ref 1.7–7.7)
Neutrophils Relative %: 44 % (ref 43–77)
Platelets: 272 10*3/uL (ref 150–400)
RBC: 4.41 MIL/uL (ref 3.87–5.11)
RDW: 13 % (ref 11.5–15.5)
WBC: 8 10*3/uL (ref 4.0–10.5)

## 2014-10-27 LAB — COMPREHENSIVE METABOLIC PANEL
ALT: 15 U/L (ref 0–35)
ANION GAP: 13 (ref 5–15)
AST: 18 U/L (ref 0–37)
Albumin: 4.2 g/dL (ref 3.5–5.2)
Alkaline Phosphatase: 109 U/L (ref 39–117)
BILIRUBIN TOTAL: 0.2 mg/dL — AB (ref 0.3–1.2)
BUN: 17 mg/dL (ref 6–23)
CHLORIDE: 104 meq/L (ref 96–112)
CO2: 25 meq/L (ref 19–32)
Calcium: 10.5 mg/dL (ref 8.4–10.5)
Creatinine, Ser: 0.68 mg/dL (ref 0.50–1.10)
GFR calc Af Amer: >90 mL/min (ref >90)
Glucose, Bld: 91 mg/dL (ref 70–99)
Potassium: 4.4 mEq/L (ref 3.7–5.3)
Sodium: 142 mEq/L (ref 137–147)
Total Protein: 7.3 g/dL (ref 6.0–8.3)

## 2014-10-27 LAB — URINALYSIS, ROUTINE W REFLEX MICROSCOPIC
BILIRUBIN URINE: NEGATIVE
Glucose, UA: NEGATIVE mg/dL
KETONES UR: 15 mg/dL — AB
NITRITE: NEGATIVE
PROTEIN: NEGATIVE mg/dL
Specific Gravity, Urine: 1.01 (ref 1.005–1.030)
UROBILINOGEN UA: 0.2 mg/dL (ref 0.0–1.0)
pH: 6.5 (ref 5.0–8.0)

## 2014-10-27 LAB — URINE MICROSCOPIC-ADD ON

## 2014-10-27 LAB — LIPASE, BLOOD: Lipase: 35 U/L (ref 11–59)

## 2014-10-27 MED ORDER — ONDANSETRON HCL 4 MG/2ML IJ SOLN
4.0000 mg | Freq: Once | INTRAMUSCULAR | Status: DC
  Filled 2014-10-27: qty 2

## 2014-10-27 MED ORDER — SODIUM CHLORIDE 0.9 % IV BOLUS (SEPSIS): 500.0000 mL | Freq: Once | INTRAVENOUS | Status: DC

## 2014-10-27 MED ORDER — ONDANSETRON 4 MG PO TBDP
4.0000 mg | ORAL_TABLET | Freq: Once | ORAL | Status: AC
  Administered 2014-10-27: 4 mg via ORAL
  Filled 2014-10-27: qty 1

## 2014-10-27 MED ORDER — BARIUM SULFATE 2.1 % PO SUSP
450.0000 mL | ORAL | Status: AC
Start: 2014-10-27 — End: 2014-10-27
  Administered 2014-10-27: 450 mL via ORAL

## 2014-10-27 NOTE — ED Notes (Signed)
C/o abd pain X 2days, worse in RLQ, sent by PCP, nausea, no V/D/F, dysuria, A/OX4, ambulatory and in NAD

## 2014-10-27 NOTE — ED Provider Notes (Signed)
CSN: [id number]     Arrival date & time 10/27/14  1956 History   First MD Initiated Contact with Patient 10/27/14 2037     Chief Complaint  Patient presents with  . Abdominal Pain     (Consider location/radiation/quality/duration/timing/severity/associated sxs/prior Treatment) HPI Comments: Patient presents to the ER for evaluation of abdominal pain. Patient reports that she has been experiencing diffuse abdominal pain for 2 days. She has had nausea but no vomiting or diarrhea. She saw her doctor today and when he examined her he found that she was experiencing a lot of tenderness in the right lower quadrant. Patient also reports that she has had some pain when she urinates. No urinary frequency.  Patient is a 53 y.o. female presenting with abdominal pain.  Abdominal Pain Associated symptoms: dysuria     Past Medical History  Diagnosis Date  . Hypertrophic pyloric stenosis   . GERD (gastroesophageal reflux disease)   . Anemia   . Arthritis   . Heart murmur   . PONV (postoperative nausea and vomiting)     HA post anesthesia  . Primary hyperparathyroidism 12/19/2011    Underwent removal of a hyperplastic left superior parathyroid March 2013, with improvement in symptoms   . 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 Abdominal pain, RLQ 09/03/2011    Patient with long history of abdominal pain 1999-2006, previously with pain in RLQ, told she would "need surgery" in [place] before moving to [place] in 2008.  Never had this surgery that had been recommended.  S/p ccy, TAH c BSO for fibromas (no cancer history).   . Headache(784.0)   . Fibromyalgia   . Gallstones   . IBS (irritable bowel syndrome)   . Pneumonia   . Non-celiac gluten sensitivity    Past Surgical History  Procedure Laterality Date  . Cholecystectomy  1998  . Parathyroidectomy  01/21/2012    Procedure: PARATHYROIDECTOMY;  Surgeon: Haywood Lasso, MD;  Location: [facility];  Service: General;  Laterality: N/A;  Left superior parathyroidectomy  . Breast cyst excision Left   . Abdominal hysterectomy  2002   Family History  Problem Relation Age of Onset  . Heart disease Daughter   . Heart disease Maternal Grandmother   . Heart disease Paternal Grandmother   . Anesthesia problems Neg Hx   . Prostate cancer Father   . Colon polyps Mother   . Colon polyps Brother   . Other Daughter 22    colon obstruction-deceased age 50  . Other Daughter     gluten sensitive  . Ulcerative colitis Mother   . Diabetes Mother   . Diabetes Brother   . Irritable bowel syndrome Mother   . Kidney disease Mother    History  Substance Use Topics  . Smoking status: Never Smoker   . Smokeless tobacco: Never Used  . Alcohol Use: No   OB History    No data available     Review of Systems  Gastrointestinal: Positive for abdominal pain.  Genitourinary: Positive for dysuria.  All other systems reviewed and are negative.     Allergies  Demerol; Imitrex; Iodine; Penicillins; Tramadol; and Morphine and related  Home Medications   Prior to Admission medications   Medication Sig Start Date End Date Taking? Authorizing Provider  ergocalciferol (VITAMIN D2) 50000 UNITS capsule Take 50,000 Units by mouth once a week.    Historical Provider, MD  ibuprofen (ADVIL,MOTRIN) 600 MG tablet  Take 1 tablet (600 mg total) by mouth every 6 (six) hours as needed. 11/25/13   Donnamae Jude, MD   BP 140/72 mmHg  Temp(Src) 98.3 F (36.8 C) (Oral)  Resp 20  Ht 5\' 2"  (1.575 m)  Wt 145 lb (65.772 kg)  BMI 26.51 kg/m2  SpO2 100% Physical Exam  Constitutional: She is oriented to person, place, and time. She appears well-developed and well-nourished. No distress.  HENT:  Head: Normocephalic and atraumatic.  Right Ear: Hearing normal.  Left Ear: Hearing normal.  Nose: Nose normal.  Mouth/Throat: Oropharynx is clear and moist and mucous membranes are normal.  Eyes: Conjunctivae and EOM  are normal. Pupils are equal, round, and reactive to light.  Neck: Normal range of motion. Neck supple.  Cardiovascular: Regular rhythm, S1 normal and S2 normal.  Exam reveals no gallop and no friction rub.   No murmur heard. Pulmonary/Chest: Effort normal and breath sounds normal. No respiratory distress. She exhibits no tenderness.  Abdominal: Soft. Normal appearance and bowel sounds are normal. There is no hepatosplenomegaly. There is tenderness in the right lower quadrant. There is no rebound, no guarding, no tenderness at McBurney's point and negative Murphy's sign. No hernia.  Musculoskeletal: Normal range of motion.  Neurological: She is alert and oriented to person, place, and time. She has normal strength. No cranial nerve deficit or sensory deficit. Coordination normal. GCS eye subscore is 4. GCS verbal subscore is 5. GCS motor subscore is 6.  Skin: Skin is warm, dry and intact. No rash noted. No cyanosis.  Psychiatric: She has a normal mood and affect. Her speech is normal and behavior is normal. Thought content normal.  Nursing note and vitals reviewed.   ED Course  Procedures (including critical care time) Labs Review Labs Reviewed  CBC WITH DIFFERENTIAL - Abnormal; Notable for the following:    Lymphocytes Relative 49 (*)    All other components within normal limits  COMPREHENSIVE METABOLIC PANEL  LIPASE, BLOOD  URINALYSIS, ROUTINE W REFLEX MICROSCOPIC    Imaging Review No results found.   EKG Interpretation None      MDM   Final diagnoses:  Abdominal pain, acute   sent to the ER by primary care doctor to be evaluated for abdominal pain. Patient experiencing diffuse abdominal pain for 2 days, now having some focal right lower quadrant tenderness. Lab work was unremarkable. Vital signs are normal. CT scan did not show any evidence of appendicitis or any other acute pathology. Patient to be discharged, symptomatically treatment.    Orpah Greek,  MD 10/28/14 [phone]

## 2014-10-27 NOTE — ED Notes (Signed)
IV attempted x's 2 without success.  Dr. Betsey Holiday made aware.  Verbal order received to discontinue IV and give pt Zofran ODT

## 2014-10-27 NOTE — ED Notes (Signed)
Pt drinking contrast for CT. 

## 2014-10-27 NOTE — ED Notes (Signed)
Pt finished drinking contrast. CT made aware.

## 2014-10-28 MED ORDER — IBUPROFEN 800 MG PO TABS: 800.0000 mg | ORAL_TABLET | Freq: Three times a day (TID) | ORAL | Status: DC

## 2014-10-28 MED ORDER — DOCUSATE SODIUM 100 MG PO CAPS: 100.0000 mg | ORAL_CAPSULE | Freq: Two times a day (BID) | ORAL | Status: DC

## 2014-10-28 MED ORDER — IBUPROFEN 800 MG PO TABS
800.0000 mg | ORAL_TABLET | Freq: Once | ORAL | Status: AC
Start: 2014-10-28 — End: 2014-10-28
  Administered 2014-10-28: 800 mg via ORAL
  Filled 2014-10-28: qty 1

## 2014-10-28 NOTE — Discharge Instructions (Signed)

## 2014-10-31 ENCOUNTER — Other Ambulatory Visit: Payer: Self-pay | Admitting: Endocrinology

## 2014-10-31 ENCOUNTER — Other Ambulatory Visit: Payer: Commercial Managed Care - PPO

## 2014-10-31 LAB — RENAL FUNCTION PANEL
Albumin: 4.4 g/dL (ref 3.5–5.2)
BUN: 13 mg/dL (ref 6–23)
CO2: 25 meq/L (ref 19–32)
Calcium: 10.3 mg/dL (ref 8.4–10.5)
Chloride: 106 mEq/L (ref 96–112)
Creat: 0.77 mg/dL (ref 0.50–1.10)
GLUCOSE: 92 mg/dL (ref 70–99)
POTASSIUM: 4 meq/L (ref 3.5–5.3)
Phosphorus: 3.3 mg/dL (ref 2.3–4.6)
SODIUM: 142 meq/L (ref 135–145)

## 2014-11-02 ENCOUNTER — Ambulatory Visit (INDEPENDENT_AMBULATORY_CARE_PROVIDER_SITE_OTHER): Payer: Commercial Managed Care - PPO | Admitting: Endocrinology

## 2014-11-02 ENCOUNTER — Encounter: Payer: Self-pay | Admitting: Endocrinology

## 2014-11-02 VITALS — BP 129/80 | HR 60 | Temp 97.8°F | Resp 14 | Ht 62.0 in | Wt 147.4 lb

## 2014-11-02 DIAGNOSIS — R61 Generalized hyperhidrosis: Secondary | ICD-10-CM

## 2014-11-02 DIAGNOSIS — E213 Hyperparathyroidism, unspecified: Secondary | ICD-10-CM

## 2014-11-02 NOTE — Progress Notes (Signed)
Patient ID: Terri Montgomery, female   DOB: 04-16-1961, 53 y.o.   MRN: [id number]    Chief complaint: Follow-up of calcium and other issues  History of Present Illness:  Referring physician: Drema Dallas  She has had hypercalcemia since at least 2012 when her level was 11.4 She had been complaining of feeling tired for several years and also hurting all over On her parathyroid scan she had a possible left upper normal and this was removed through minimally invasive surgery in 2013 Calcium after surgery was down to 10.3 compared to 11.2 before the surgery  However she still does not feel subjectively any better with her fatigue and generalized aches Her calcium had continued to be high normal since her surgery and in 8/15 with her PCP was increased at 11.0 PTH level  was 46 She also had a vitamin D level of 20.7  Her calcium more recently appears to be consistently normal  Vitamin D deficiency:  She was told to take OTC 1000 units daily but has not done so.  Prescription vitamin D was stopped because of her relatively high active vitamin D level   Lab Results  Component Value Date   CALCIUM 10.3 10/31/2014   CALCIUM 10.5 10/27/2014   CALCIUM 10.1 07/20/2014   CALCIUM 10.1 10/05/2013   CALCIUM 10.3 02/02/2013   CALCIUM 10.1 01/18/2013   CALCIUM 10.8* 09/25/2012   CALCIUM 10.0 06/23/2012   CALCIUM 9.7 04/01/2012    Lab Results  Component Value Date   PTH 272.2* 12/02/2011   CALCIUM 10.3 10/31/2014   PHOS 3.3 10/31/2014          Medication List       This list is accurate as of: 11/02/14  8:28 AM.  Always use your most recent med list.               ciprofloxacin 500 MG tablet  Commonly known as:  CIPRO     ibuprofen 800 MG tablet  Commonly known as:  ADVIL,MOTRIN  Take 1 tablet (800 mg total) by mouth 3 (three) times daily.        Allergies:  Allergies  Allergen Reactions  . Demerol Shortness Of Breath and Rash  . Imitrex [Sumatriptan Base] Shortness Of  Breath, Rash and Other (See Comments)    Increases headaches  . Iodine Shortness Of Breath    Pt states feels like having a heart attack   . Penicillins Shortness Of Breath  . Tramadol Shortness Of Breath and Other (See Comments)    Headaches  . Morphine And Related Nausea And Vomiting and Rash    In low doses patient did not experience negative side effects. Rash     Past Medical History  Diagnosis Date  . Hypertrophic pyloric stenosis   . GERD (gastroesophageal reflux disease)   . Anemia   . Arthritis   . Heart murmur   . PONV (postoperative nausea and vomiting)     HA post anesthesia  . Primary hyperparathyroidism 12/19/2011    Underwent removal of a hyperplastic left superior parathyroid March 2013, with improvement in symptoms   . 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 Abdominal pain, RLQ 09/03/2011    Patient with long history of abdominal pain 1999-2006, previously with pain in RLQ, told she would "need surgery" in [place] before moving to [place] in 2008.  Never had this surgery that had been recommended.  S/p ccy, TAH c BSO for  fibromas (no cancer history).   . Headache(784.0)   . Fibromyalgia   . Gallstones   . IBS (irritable bowel syndrome)   . Pneumonia   . Non-celiac gluten sensitivity     Mom osteo     Past Surgical History  Procedure Laterality Date  . Cholecystectomy  1998  . Parathyroidectomy  01/21/2012    Procedure: PARATHYROIDECTOMY;  Surgeon: Haywood Lasso, MD;  Location: [facility];  Service: General;  Laterality: N/A;  Left superior parathyroidectomy  . Breast cyst excision Left   . Abdominal hysterectomy  2002    Family History  Problem Relation Age of Onset  . Heart disease Daughter   . Heart disease Maternal Grandmother   . Heart disease Paternal Grandmother   . Anesthesia problems Neg Hx   . Prostate cancer Father   . Colon polyps Mother   . Colon polyps Brother   . Other Daughter 50     colon obstruction-deceased age 24  . Other Daughter     gluten sensitive  . Ulcerative colitis Mother   . Diabetes Mother   . Diabetes Brother   . Irritable bowel syndrome Mother   . Kidney disease Mother     Social History:  reports that she has never smoked. She has never used smokeless tobacco. She reports that she does not drink alcohol or use illicit drugs.  ROS  Bone density: Has not had this since she was in [place] several years ago and results not available. She has not been told to have osteoporosis She has not been on HRT except for a few years after her surgical menopause at age 45  Hot flashes and sweating episodes: These are still present and not very symptomatic.  Does not want treatment for this  She complains of fatigue for several years   She is concerned about her  weight. She thinks this is despite fairly good diet and trying to walk regularly   Wt Readings from Last 3 Encounters:  11/02/14 147 lb 6.4 oz (66.86 kg)  10/27/14 145 lb (65.772 kg)  07/20/14 148 lb (67.132 kg)     EXAM:  BP 129/80 mmHg  Pulse 60  Temp(Src) 97.8 F (36.6 C)  Resp 14  Ht 5\' 2"  (1.575 m)  Wt 147 lb 6.4 oz (66.86 kg)  BMI 26.95 kg/m2  SpO2 98%    Assessment/Plan:   HYPERCALCEMIA:  The patient had long-standing hypercalcemia related to hyperparathyroidism Her highest calcium in the past has been 11.4 although records from her previous evaluation in [place] is not available She had minimal improvement in subjective symptoms of fatigue and generalized pain with her parathyroid surgery  Subsequently she has had relatively normal calcium levels consistently Discussed with her that she does not need parathyroid surgery again with her calcium levels being normal  However she does need to have assessment of her bone density If this is normal she can continue follow-up regularly with her PCP   VITAMIN D deficiency: This is mild with her vitamin D level of 20.7  She  will start her OTC vitamin D 1000 units daily as directed  Hot flashes and sweating: This is likely to be menopausal related. She does not want to consider clonidine currently   [facility] 11/02/2014, 8:28 AM      No visits with results within 1 Day(s) from this visit. Latest known visit with results is:  Orders Only on 10/31/2014  Component Date Value Ref Range Status  . Sodium 10/31/2014 142  135 - 145 mEq/L Final  . Potassium 10/31/2014 4.0  3.5 - 5.3 mEq/L Final  . Chloride 10/31/2014 106  96 - 112 mEq/L Final  . CO2 10/31/2014 25  19 - 32 mEq/L Final  . Glucose, Bld 10/31/2014 92  70 - 99 mg/dL Final  . BUN 10/31/2014 13  6 - 23 mg/dL Final  . Creat 10/31/2014 0.77  0.50 - 1.10 mg/dL Final  . Albumin 10/31/2014 4.4  3.5 - 5.2 g/dL Final  . Calcium 10/31/2014 10.3  8.4 - 10.5 mg/dL Final  . Phosphorus 10/31/2014 3.3  2.3 - 4.6 mg/dL Final

## 2014-11-02 NOTE — Patient Instructions (Addendum)
Vitamin D3 take 1000mg  daily  Take Prilosec OTC

## 2014-11-15 ENCOUNTER — Inpatient Hospital Stay: Admission: RE | Admit: 2014-11-15 | Payer: Commercial Managed Care - PPO | Source: Ambulatory Visit

## 2014-11-15 DIAGNOSIS — Z8639 Personal history of other endocrine, nutritional and metabolic disease: Secondary | ICD-10-CM | POA: Insufficient documentation

## 2014-11-15 DIAGNOSIS — R319 Hematuria, unspecified: Secondary | ICD-10-CM | POA: Diagnosis not present

## 2014-11-15 DIAGNOSIS — M1990 Unspecified osteoarthritis, unspecified site: Secondary | ICD-10-CM | POA: Diagnosis not present

## 2014-11-15 DIAGNOSIS — Z8742 Personal history of other diseases of the female genital tract: Secondary | ICD-10-CM | POA: Diagnosis not present

## 2014-11-15 DIAGNOSIS — Z880 Allergy status to penicillin: Secondary | ICD-10-CM | POA: Insufficient documentation

## 2014-11-15 DIAGNOSIS — Z90710 Acquired absence of both cervix and uterus: Secondary | ICD-10-CM | POA: Diagnosis not present

## 2014-11-15 DIAGNOSIS — Z8701 Personal history of pneumonia (recurrent): Secondary | ICD-10-CM | POA: Diagnosis not present

## 2014-11-15 DIAGNOSIS — R109 Unspecified abdominal pain: Secondary | ICD-10-CM | POA: Diagnosis present

## 2014-11-15 DIAGNOSIS — N939 Abnormal uterine and vaginal bleeding, unspecified: Secondary | ICD-10-CM | POA: Insufficient documentation

## 2014-11-15 DIAGNOSIS — K5909 Other constipation: Secondary | ICD-10-CM | POA: Diagnosis not present

## 2014-11-15 DIAGNOSIS — R011 Cardiac murmur, unspecified: Secondary | ICD-10-CM | POA: Insufficient documentation

## 2014-11-15 DIAGNOSIS — Z862 Personal history of diseases of the blood and blood-forming organs and certain disorders involving the immune mechanism: Secondary | ICD-10-CM | POA: Insufficient documentation

## 2014-11-15 DIAGNOSIS — Z9049 Acquired absence of other specified parts of digestive tract: Secondary | ICD-10-CM | POA: Insufficient documentation

## 2014-11-16 ENCOUNTER — Emergency Department (HOSPITAL_COMMUNITY)
Admission: EM | Admit: 2014-11-16 | Discharge: 2014-11-16 | Disposition: A | Discharge status: Home / Self Care | Payer: Commercial Managed Care - PPO | Attending: Emergency Medicine | Admitting: Emergency Medicine

## 2014-11-16 ENCOUNTER — Emergency Department (HOSPITAL_COMMUNITY): Payer: Commercial Managed Care - PPO

## 2014-11-16 ENCOUNTER — Encounter (HOSPITAL_COMMUNITY): Payer: Self-pay | Admitting: Emergency Medicine

## 2014-11-16 DIAGNOSIS — R109 Unspecified abdominal pain: Secondary | ICD-10-CM

## 2014-11-16 DIAGNOSIS — K5904 Chronic idiopathic constipation: Secondary | ICD-10-CM

## 2014-11-16 LAB — URINALYSIS, ROUTINE W REFLEX MICROSCOPIC
Bilirubin Urine: NEGATIVE
Glucose, UA: NEGATIVE mg/dL
Ketones, ur: NEGATIVE mg/dL
LEUKOCYTES UA: NEGATIVE
Nitrite: NEGATIVE
Protein, ur: NEGATIVE mg/dL
Specific Gravity, Urine: 1.007 (ref 1.005–1.030)
Urobilinogen, UA: 0.2 mg/dL (ref 0.0–1.0)
pH: 6 (ref 5.0–8.0)

## 2014-11-16 LAB — CBC WITH DIFFERENTIAL/PLATELET
BASOS ABS: 0 10*3/uL (ref 0.0–0.1)
BASOS PCT: 0 % (ref 0–1)
Eosinophils Absolute: 0.1 10*3/uL (ref 0.0–0.7)
Eosinophils Relative: 1 % (ref 0–5)
HEMATOCRIT: 36.9 % (ref 36.0–46.0)
Hemoglobin: 12.4 g/dL (ref 12.0–15.0)
LYMPHS ABS: 1.9 10*3/uL (ref 0.7–4.0)
Lymphocytes Relative: 16 % (ref 12–46)
MCH: 30.2 pg (ref 26.0–34.0)
MCHC: 33.6 g/dL (ref 30.0–36.0)
MCV: 90 fL (ref 78.0–100.0)
MONOS PCT: 5 % (ref 3–12)
Monocytes Absolute: 0.5 10*3/uL (ref 0.1–1.0)
NEUTROS ABS: 9.3 10*3/uL — AB (ref 1.7–7.7)
NEUTROS PCT: 78 % — AB (ref 43–77)
PLATELETS: 279 10*3/uL (ref 150–400)
RBC: 4.1 MIL/uL (ref 3.87–5.11)
RDW: 13.3 % (ref 11.5–15.5)
WBC: 11.8 10*3/uL — ABNORMAL HIGH (ref 4.0–10.5)

## 2014-11-16 LAB — COMPREHENSIVE METABOLIC PANEL
ALK PHOS: 96 U/L (ref 39–117)
ALT: 23 U/L (ref 0–35)
ANION GAP: 9 (ref 5–15)
AST: 27 U/L (ref 0–37)
Albumin: 4.2 g/dL (ref 3.5–5.2)
BILIRUBIN TOTAL: 0.4 mg/dL (ref 0.3–1.2)
BUN: 13 mg/dL (ref 6–23)
CALCIUM: 10.2 mg/dL (ref 8.4–10.5)
CHLORIDE: 103 meq/L (ref 96–112)
CO2: 25 mmol/L (ref 19–32)
Creatinine, Ser: 0.75 mg/dL (ref 0.50–1.10)
GFR calc Af Amer: >90 mL/min (ref >90)
GFR calc non Af Amer: >90 mL/min (ref >90)
Glucose, Bld: 126 mg/dL — ABNORMAL HIGH (ref 70–99)
Potassium: 4.2 mmol/L (ref 3.5–5.1)
Sodium: 137 mmol/L (ref 135–145)
Total Protein: 6.6 g/dL (ref 6.0–8.3)

## 2014-11-16 LAB — URINE MICROSCOPIC-ADD ON

## 2014-11-16 LAB — LIPASE, BLOOD: LIPASE: 32 U/L (ref 11–59)

## 2014-11-16 MED ORDER — SODIUM CHLORIDE 0.9 % IV SOLN
1000.0000 mL | Freq: Once | INTRAVENOUS | Status: AC
  Administered 2014-11-16: 1000 mL via INTRAVENOUS

## 2014-11-16 MED ORDER — METOCLOPRAMIDE HCL 5 MG/ML IJ SOLN
10.0000 mg | Freq: Once | INTRAMUSCULAR | Status: AC
  Administered 2014-11-16: 10 mg via INTRAVENOUS
  Filled 2014-11-16: qty 2

## 2014-11-16 MED ORDER — DIPHENHYDRAMINE HCL 50 MG/ML IJ SOLN
25.0000 mg | Freq: Once | INTRAMUSCULAR | Status: AC
  Administered 2014-11-16: 25 mg via INTRAVENOUS
  Filled 2014-11-16: qty 1

## 2014-11-16 MED ORDER — SODIUM CHLORIDE 0.9 % IV SOLN
1000.0000 mL | INTRAVENOUS | Status: DC
  Administered 2014-11-16: 1000 mL via INTRAVENOUS

## 2014-11-16 MED ORDER — IOHEXOL 300 MG/ML  SOLN: 50.0000 mL | Freq: Once | INTRAMUSCULAR | Status: DC | PRN

## 2014-11-16 MED ORDER — PEG 3350/ELECTROLYTES 240 G PO SOLR: 8.0000 [oz_av] | ORAL | Status: DC

## 2014-11-16 NOTE — ED Notes (Signed)
Pt. presents with multiple complaints : Generalized abdominal pain for 2 weeks , vaginal bleeding , constipation and nausea / emesis . Denies fever or chills.

## 2014-11-16 NOTE — ED Provider Notes (Signed)
CSN: [id number]     Arrival date & time 11/15/14  2359 History  This chart was scribed for Delora Fuel, MD by Molli Posey, ED Scribe. This patient was seen in room B15C/B15C and the patient's care was started 2:36 AM.     Chief Complaint  Patient presents with  . Abdominal Pain  . Vaginal Bleeding  . Constipation  . Emesis   The history is provided by the patient. No language interpreter was used.   HPI Comments: Terri Montgomery is a 54 y.o. female with a history of GERD, PONV, IBS and hypertrophic pyloric stenosis who presents to the Emergency Department complaining of generalized abdominal pain for the last 2 weeks. She states that her pain worsened 3 days ago. Pt complains of associated nausea, vomiting, constipation and says she has noticed some minimal blood in her saliva. She states that her stomach is still hurting at this time. Pt rates her abdominal pain as a 7/10 currently. Pt states she was at the ER about 1 month ago for abdominal pain that resolved and then returned about 2 weeks ago. She denies fever or chills.    Pt is also complaining of hematuria as well. She reports associated dysuria, urgency, and states that she feels like she has to urinate a little more after she has already completely urinated.   Past Medical History  Diagnosis Date  . Hypertrophic pyloric stenosis   . GERD (gastroesophageal reflux disease)   . Anemia   . Arthritis   . Heart murmur   . PONV (postoperative nausea and vomiting)     HA post anesthesia  . Primary hyperparathyroidism 12/19/2011    Underwent removal of a hyperplastic left superior parathyroid March 2013, with improvement in symptoms   . 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 Abdominal pain, RLQ 09/03/2011    Patient with long history of abdominal pain 1999-2006, previously with pain in RLQ, told she would "need surgery" in [place] before moving to [place] in 2008.  Never had this  surgery that had been recommended.  S/p ccy, TAH c BSO for fibromas (no cancer history).   . Headache(784.0)   . Fibromyalgia   . Gallstones   . IBS (irritable bowel syndrome)   . Pneumonia   . Non-celiac gluten sensitivity    Past Surgical History  Procedure Laterality Date  . Cholecystectomy  1998  . Parathyroidectomy  01/21/2012    Procedure: PARATHYROIDECTOMY;  Surgeon: Haywood Lasso, MD;  Location: [facility];  Service: General;  Laterality: N/A;  Left superior parathyroidectomy  . Breast cyst excision Left   . Abdominal hysterectomy  2002   Family History  Problem Relation Age of Onset  . Heart disease Daughter   . Heart disease Maternal Grandmother   . Heart disease Paternal Grandmother   . Anesthesia problems Neg Hx   . Prostate cancer Father   . Colon polyps Mother   . Colon polyps Brother   . Other Daughter 28    colon obstruction-deceased age 26  . Other Daughter     gluten sensitive  . Ulcerative colitis Mother   . Diabetes Mother   . Diabetes Brother   . Irritable bowel syndrome Mother   . Kidney disease Mother    History  Substance Use Topics  . Smoking status: Never Smoker   . Smokeless tobacco: Never Used  . Alcohol Use: No   OB History    No  data available     Review of Systems  Constitutional: Negative for fever and chills.  Gastrointestinal: Positive for nausea, vomiting, abdominal pain and constipation.  Genitourinary: Positive for dysuria, urgency, hematuria and vaginal bleeding.  All other systems reviewed and are negative.     Allergies  Demerol; Imitrex; Iodine; Penicillins; Tramadol; Ciprofloxacin; and Morphine and related  Home Medications   Prior to Admission medications   Medication Sig Start Date End Date Taking? Authorizing Provider  acetaminophen (TYLENOL) 325 MG tablet Take 650 mg by mouth every 6 (six) hours as needed for mild pain.   Yes Historical Provider, MD  ciprofloxacin (CIPRO) 500 MG tablet  10/28/14   Historical  Provider, MD  ibuprofen (ADVIL,MOTRIN) 800 MG tablet Take 1 tablet (800 mg total) by mouth 3 (three) times daily. Patient not taking: Reported on 11/16/2014 10/28/14   Orpah Greek, MD   BP 112/69 mmHg  Pulse 83  Temp(Src) 98.3 F (36.8 C) (Oral)  Resp 16  Ht 5\' 2"  (1.575 m)  Wt 145 lb (65.772 kg)  BMI 26.51 kg/m2  SpO2 99% Physical Exam  Constitutional: She is oriented to person, place, and time. She appears well-developed and well-nourished.  HENT:  Head: Normocephalic and atraumatic.  Eyes: Pupils are equal, round, and reactive to light. Right eye exhibits no discharge. Left eye exhibits no discharge.  Neck: Normal range of motion. Neck supple. No JVD present.  Cardiovascular: Normal rate, regular rhythm and normal heart sounds.   No murmur heard. Pulmonary/Chest: Effort normal and breath sounds normal. She has no wheezes. She has no rales.  Abdominal: Soft. Bowel sounds are normal. She exhibits mass. She exhibits no distension. There is tenderness. There is no rebound and no guarding.  Mild periumbilical tenderness   Musculoskeletal: Normal range of motion. She exhibits no edema.  Lymphadenopathy:    She has no cervical adenopathy.  Neurological: She is alert and oriented to person, place, and time. She has normal reflexes. No cranial nerve deficit. Coordination normal.  Skin: Skin is warm. No rash noted.  Psychiatric: She has a normal mood and affect. Her behavior is normal. Thought content normal.  Nursing note and vitals reviewed.   ED Course  Procedures   DIAGNOSTIC STUDIES: Oxygen Saturation is 99% on RA, normal by my interpretation.    COORDINATION OF CARE: 2:44 AM Discussed treatment plan with pt at bedside and pt agreed to plan.   Labs Review Results for orders placed or performed during the hospital encounter of 11/16/14  CBC with Differential  Result Value Ref Range   WBC 11.8 (H) 4.0 - 10.5 K/uL   RBC 4.10 3.87 - 5.11 MIL/uL   Hemoglobin 12.4  12.0 - 15.0 g/dL   HCT 36.9 36.0 - 46.0 %   MCV 90.0 78.0 - 100.0 fL   MCH 30.2 26.0 - 34.0 pg   MCHC 33.6 30.0 - 36.0 g/dL   RDW 13.3 11.5 - 15.5 %   Platelets 279 150 - 400 K/uL   Neutrophils Relative % 78 (H) 43 - 77 %   Neutro Abs 9.3 (H) 1.7 - 7.7 K/uL   Lymphocytes Relative 16 12 - 46 %   Lymphs Abs 1.9 0.7 - 4.0 K/uL   Monocytes Relative 5 3 - 12 %   Monocytes Absolute 0.5 0.1 - 1.0 K/uL   Eosinophils Relative 1 0 - 5 %   Eosinophils Absolute 0.1 0.0 - 0.7 K/uL   Basophils Relative 0 0 - 1 %   Basophils  Absolute 0.0 0.0 - 0.1 K/uL  Comprehensive metabolic panel  Result Value Ref Range   Sodium 137 135 - 145 mmol/L   Potassium 4.2 3.5 - 5.1 mmol/L   Chloride 103 96 - 112 mEq/L   CO2 25 19 - 32 mmol/L   Glucose, Bld 126 (H) 70 - 99 mg/dL   BUN 13 6 - 23 mg/dL   Creatinine, Ser 0.75 0.50 - 1.10 mg/dL   Calcium 10.2 8.4 - 10.5 mg/dL   Total Protein 6.6 6.0 - 8.3 g/dL   Albumin 4.2 3.5 - 5.2 g/dL   AST 27 0 - 37 U/L   ALT 23 0 - 35 U/L   Alkaline Phosphatase 96 39 - 117 U/L   Total Bilirubin 0.4 0.3 - 1.2 mg/dL   GFR calc non Af Amer >90 >90 mL/min   GFR calc Af Amer >90 >90 mL/min   Anion gap 9 5 - 15  Lipase, blood  Result Value Ref Range   Lipase 32 11 - 59 U/L  Urinalysis, Routine w reflex microscopic  Result Value Ref Range   Color, Urine STRAW (A) YELLOW   APPearance CLEAR CLEAR   Specific Gravity, Urine 1.007 1.005 - 1.030   pH 6.0 5.0 - 8.0   Glucose, UA NEGATIVE NEGATIVE mg/dL   Hgb urine dipstick SMALL (A) NEGATIVE   Bilirubin Urine NEGATIVE NEGATIVE   Ketones, ur NEGATIVE NEGATIVE mg/dL   Protein, ur NEGATIVE NEGATIVE mg/dL   Urobilinogen, UA 0.2 0.0 - 1.0 mg/dL   Nitrite NEGATIVE NEGATIVE   Leukocytes, UA NEGATIVE NEGATIVE  Urine microscopic-add on  Result Value Ref Range   Squamous Epithelial / LPF FEW (A) RARE   WBC, UA 0-2 <3 WBC/hpf   RBC / HPF 3-6 <3 RBC/hpf   Bacteria, UA FEW (A) RARE   Imaging Review Ct Abdomen Pelvis Wo  Contrast  11/16/2014   CLINICAL DATA:  Periumbilical pain radiating to RIGHT lower quadrant beginning 2 weeks ago. Nausea and vomiting.  EXAM: CT ABDOMEN AND PELVIS WITHOUT CONTRAST  TECHNIQUE: Multidetector CT imaging of the abdomen and pelvis was performed following the standard protocol without IV contrast.  COMPARISON:  CT of the abdomen and pelvis October 27, 2014  FINDINGS: LUNG BASES: Included view of the lung bases are clear. The visualized heart and pericardium are unremarkable.  KIDNEYS/BLADDER: Kidneys are orthotopic, demonstrating normal size and morphology. No nephrolithiasis, hydronephrosis; limited assessment for renal masses on this nonenhanced examination. The unopacified ureters are normal in course and caliber. Urinary bladder is partially distended and unremarkable.  SOLID ORGANS: The spleen, pancreas and RIGHT adrenal gland are unremarkable for this non-contrast examination. Multiple low-density hepatic cysts measure up to 3.8 cm, unchanged. Status post cholecystectomy. 9 mm LEFT adrenal adenoma (-6.75 Hounsfield units)  GASTROINTESTINAL TRACT: The stomach, small and large bowel are normal in course and caliber without inflammatory changes, the sensitivity may be decreased by lack of enteric contrast. Moderate amount of retained large bowel stool. Normal appendix.  PERITONEUM/RETROPERITONEUM: No intraperitoneal free fluid nor free air. Aortoiliac vessels are normal in course and caliber. No lymphadenopathy by CT size criteria. Status post hysterectomy. Phleboliths in the pelvis.  SOFT TISSUES/ OSSEOUS STRUCTURES: Nonsuspicious. Grade 1 L5-S1 anterolisthesis without spondylolysis, moderate L5-S1 facet arthropathy with moderate neural foraminal narrowing. Small fat containing umbilical hernia.  IMPRESSION: Moderate amount of retained large bowel stool without bowel obstruction or acute intra-abdominal/pelvic process.  No urolithiasis.  Normal appendix.  Status postcholecystectomy.    Electronically Signed   By:  Courtnay  Bloomer   On: 11/16/2014 06:06   MDM   Final diagnoses:  Abdominal pain, unspecified abdominal location  Constipation - functional    Abdominal pain seems benign. No evidence of acute surgical abdomen. Old records are reviewed and prior CT scan reviewed-she did have significant stool burden on her last CT scan. She is reluctant to get any medication for nausea she thinks constipates her. I have advised her that if she is disconcerting about antiemetics that she should not get narcotics either because they would be more prone to constipation. She was sent for CT scan which confirms large stool burden throughout the colon. I feel this is causing most of her symptoms. She is given a prescription for GoLYTELY and is to follow-up with PCP. Advised to maintain herself on a high-fiber diet. Return if symptoms worsen.   I personally performed the services described in this documentation, which was scribed in my presence. The recorded information has been reviewed and is accurate.       Delora Fuel, MD 81/84/03 [id number]

## 2014-11-16 NOTE — Discharge Instructions (Signed)
Stay on a high fiber diet. Return if pain is getting worse.  Abdominal Pain Many things can cause abdominal pain. Usually, abdominal pain is not caused by a disease and will improve without treatment. It can often be observed and treated at home. Your health care provider will do a physical exam and possibly order blood tests and X-rays to help determine the seriousness of your pain. However, in many cases, more time must pass before a clear cause of the pain can be found. Before that point, your health care provider may not know if you need more testing or further treatment. HOME CARE INSTRUCTIONS  Monitor your abdominal pain for any changes. The following actions may help to alleviate any discomfort you are experiencing:  Only take over-the-counter or prescription medicines as directed by your health care provider.  Do not take laxatives unless directed to do so by your health care provider.  Try a clear liquid diet (broth, tea, or water) as directed by your health care provider. Slowly move to a bland diet as tolerated. SEEK MEDICAL CARE IF:  You have unexplained abdominal pain.  You have abdominal pain associated with nausea or diarrhea.  You have pain when you urinate or have a bowel movement.  You experience abdominal pain that wakes you in the night.  You have abdominal pain that is worsened or improved by eating food.  You have abdominal pain that is worsened with eating fatty foods.  You have a fever. SEEK IMMEDIATE MEDICAL CARE IF:   Your pain does not go away within 2 hours.  You keep throwing up (vomiting).  Your pain is felt only in portions of the abdomen, such as the right side or the left lower portion of the abdomen.  You pass bloody or black tarry stools. MAKE SURE YOU:  Understand these instructions.   Will watch your condition.   Will get help right away if you are not doing well or get worse.  Document Released: 08/07/2005 Document Revised:  11/02/2013 Document Reviewed: 07/07/2013 [facility] Patient Information 2015 [place], [place]. This information is not intended to replace advice given to you by your health care provider. Make sure you discuss any questions you have with your health care provider.  Constipation Constipation is when a person has fewer than three bowel movements a week, has difficulty having a bowel movement, or has stools that are dry, hard, or larger than normal. As people grow older, constipation is more common. If you try to fix constipation with medicines that make you have a bowel movement (laxatives), the problem may get worse. Long-term laxative use may cause the muscles of the colon to become weak. A low-fiber diet, not taking in enough fluids, and taking certain medicines may make constipation worse.  CAUSES   Certain medicines, such as antidepressants, pain medicine, iron supplements, antacids, and water pills.   Certain diseases, such as diabetes, irritable bowel syndrome (IBS), thyroid disease, or depression.   Not drinking enough water.   Not eating enough fiber-rich foods.   Stress or travel.   Lack of physical activity or exercise.   Ignoring the urge to have a bowel movement.   Using laxatives too much.  SIGNS AND SYMPTOMS   Having fewer than three bowel movements a week.   Straining to have a bowel movement.   Having stools that are hard, dry, or larger than normal.   Feeling full or bloated.   Pain in the lower abdomen.   Not feeling relief after  having a bowel movement.  DIAGNOSIS  Your health care provider will take a medical history and perform a physical exam. Further testing may be done for severe constipation. Some tests may include:  A barium enema X-ray to examine your rectum, colon, and, sometimes, your small intestine.   A sigmoidoscopy to examine your lower colon.   A colonoscopy to examine your entire colon. TREATMENT  Treatment will depend on the  severity of your constipation and what is causing it. Some dietary treatments include drinking more fluids and eating more fiber-rich foods. Lifestyle treatments may include regular exercise. If these diet and lifestyle recommendations do not help, your health care provider may recommend taking over-the-counter laxative medicines to help you have bowel movements. Prescription medicines may be prescribed if over-the-counter medicines do not work.  HOME CARE INSTRUCTIONS   Eat foods that have a lot of fiber, such as fruits, vegetables, whole grains, and beans.  Limit foods high in fat and processed sugars, such as french fries, hamburgers, cookies, candies, and soda.   A fiber supplement may be added to your diet if you cannot get enough fiber from foods.   Drink enough fluids to keep your urine clear or pale yellow.   Exercise regularly or as directed by your health care provider.   Go to the restroom when you have the urge to go. Do not hold it.   Only take over-the-counter or prescription medicines as directed by your health care provider. Do not take other medicines for constipation without talking to your health care provider first.  [place] IF:   You have bright red blood in your stool.   Your constipation lasts for more than 4 days or gets worse.   You have abdominal or rectal pain.   You have thin, pencil-like stools.   You have unexplained weight loss. MAKE SURE YOU:   Understand these instructions.  Will watch your condition.  Will get help right away if you are not doing well or get worse. Document Released: 07/26/2004 Document Revised: 11/02/2013 Document Reviewed: 08/09/2013 [facility] Patient Information 2015 [place], [place]. This information is not intended to replace advice given to you by your health care provider. Make sure you discuss any questions you have with your health care provider.  Polyethylene Glycol; Electrolytes oral  solution What is this medicine? POLYETHYLENE GLYCOL; ELECTROLYTES (pol ee ETH i leen GLYE col; i LEK truh lahyts) is a laxative. It is used to clean out the bowel before a colonoscopy. This medicine may be used for other purposes; ask your health care provider or pharmacist if you have questions. COMMON BRAND NAME(S): Colyte, GaviLyte-C, GaviLyte-G, GaviLyte-N, GoLYTELY, NuLYTELY, OCL, Suclear, TriLyte What should I tell my health care provider before I take this medicine? They need to know if you have any of these conditions: -any chronic disease of the intestine, stomach, or throat -bloating or pain in stomach area -difficulty swallowing -G6PD deficiency -heart disease -kidney disease -low levels of sodium in the blood -phenylketonuria -seizures -an unusual or allergic reaction to polyethylene glycol, other medicines, dyes, or preservatives -pregnant or trying to get pregnant -breast-feeding How should I use this medicine? Take this medicine by mouth. Before using this medicine you or your pharmacist must fill the container with the amount of water indicated on the package label. Chill solution before using to improve taste. Shake well before each dose. Your doctor will tell you what time to start this medicine. Take exactly as directed. You will usually  have the first bowel movement about 1 hour after you begin drinking the solution. After that, you will have frequent watery bowel movements. You will need to follow a special diet before your procedure. Talk to your doctor. Do not eat any solid foods for 3 to 4 hours before taking this medicine. A special MedGuide will be given to you by the pharmacist with each prescription and refill. Be sure to read this information carefully each time. Talk to your pediatrician regarding the use of this medicine in children. Special care may be needed. Overdosage: If you think you have taken too much of this medicine contact a poison control center or  emergency room at once. NOTE: This medicine is only for you. Do not share this medicine with others. What if I miss a dose? You should talk to your doctor if you are not able to complete the bowel preparation as advised. What may interact with this medicine? Do not take any other medicine by mouth starting one hour before you take this medicine. Talk to your doctor about when to take your other medicines. This medicine may interact with the following medications: -certain medicines for blood pressure, heart disease, irregular heart beat -certain medicines for kidney disease -certain medicines for seizures like carbamazepine, phenobarbital, phenytoin -diuretics -laxatives -NSAIDS, medicines for pain and inflammation, like ibuprofen or naproxen This list may not describe all possible interactions. Give your health care provider a list of all the medicines, herbs, non-prescription drugs, or dietary supplements you use. Also tell them if you smoke, drink alcohol, or use illegal drugs. Some items may interact with your medicine. What should I watch for while using this medicine? Tell your doctor if you experience any changes in bowel habits that are severe or last longer than three weeks. Do not inhale dust from the solution powder. This can make breathing difficult or may cause sneezing or an allergic-type reaction. Bloating may occur before the first bowel movement. If your discomfort continues while you are drinking the solution, stop drinking the solution temporarily or drink each portion with longer spaces in between until your symptoms disappear. Contact your doctor or health care professional if you have any concerns. What side effects may I notice from receiving this medicine? Side effects that you should report to your doctor or health care professional as soon as possible: -allergic reactions like skin rash, itching or hives, swelling of the face, lips, or tongue -breathing problems -chest  tightness -dizziness -fast, irregular heartbeat -headache -seizures -trouble passing urine or change in the amount of urine -vomiting Side effects that usually do not require medical attention (report to your doctor or health care professional if they continue or are bothersome): -anal irritation -bloating, pain, or distension of the stomach -chills -increased hunger or thirst -nausea -stomach gas -tiredness -trouble sleeping This list may not describe all possible side effects. Call your doctor for medical advice about side effects. You may report side effects to FDA at 1-800-FDA-1088. Where should I keep my medicine? Keep out of the reach of children. Store the solution in the refrigerator to improve the taste. Do not freeze. Throw away any unused medicine 48 hours after mixing. NOTE: This sheet is a summary. It may not cover all possible information. If you have questions about this medicine, talk to your doctor, pharmacist, or health care provider.  2015, [employer]. (2010-03-29 12:51:08)

## 2014-11-16 NOTE — ED Notes (Signed)
Patient transported to CT 

## 2014-11-29 ENCOUNTER — Ambulatory Visit: Payer: Commercial Managed Care - PPO | Admitting: Physician Assistant

## 2015-04-11 ENCOUNTER — Other Ambulatory Visit: Payer: Self-pay

## 2015-04-11 DIAGNOSIS — N644 Mastodynia: Secondary | ICD-10-CM

## 2015-04-14 ENCOUNTER — Other Ambulatory Visit: Payer: Self-pay

## 2015-04-14 DIAGNOSIS — N644 Mastodynia: Secondary | ICD-10-CM

## 2015-04-18 ENCOUNTER — Other Ambulatory Visit: Payer: Self-pay | Admitting: Family Medicine

## 2015-04-18 DIAGNOSIS — N644 Mastodynia: Secondary | ICD-10-CM

## 2015-04-19 ENCOUNTER — Ambulatory Visit
Admission: RE | Admit: 2015-04-19 | Discharge: 2015-04-19 | Disposition: A | Discharge status: Home / Self Care | Payer: Commercial Managed Care - PPO | Source: Ambulatory Visit

## 2015-04-19 DIAGNOSIS — N644 Mastodynia: Secondary | ICD-10-CM

## 2015-05-15 ENCOUNTER — Emergency Department (HOSPITAL_COMMUNITY)
Admission: EM | Admit: 2015-05-15 | Discharge: 2015-05-15 | Disposition: A | Discharge status: Home / Self Care | Payer: Commercial Managed Care - PPO | Attending: Emergency Medicine | Admitting: Emergency Medicine

## 2015-05-15 ENCOUNTER — Emergency Department (HOSPITAL_COMMUNITY): Payer: Commercial Managed Care - PPO

## 2015-05-15 ENCOUNTER — Encounter (HOSPITAL_COMMUNITY): Payer: Self-pay | Admitting: Emergency Medicine

## 2015-05-15 DIAGNOSIS — Z90710 Acquired absence of both cervix and uterus: Secondary | ICD-10-CM | POA: Insufficient documentation

## 2015-05-15 DIAGNOSIS — R110 Nausea: Secondary | ICD-10-CM | POA: Diagnosis not present

## 2015-05-15 DIAGNOSIS — Z862 Personal history of diseases of the blood and blood-forming organs and certain disorders involving the immune mechanism: Secondary | ICD-10-CM | POA: Diagnosis not present

## 2015-05-15 DIAGNOSIS — R197 Diarrhea, unspecified: Secondary | ICD-10-CM | POA: Diagnosis not present

## 2015-05-15 DIAGNOSIS — R011 Cardiac murmur, unspecified: Secondary | ICD-10-CM | POA: Insufficient documentation

## 2015-05-15 DIAGNOSIS — Z8719 Personal history of other diseases of the digestive system: Secondary | ICD-10-CM | POA: Insufficient documentation

## 2015-05-15 DIAGNOSIS — Z3202 Encounter for pregnancy test, result negative: Secondary | ICD-10-CM | POA: Insufficient documentation

## 2015-05-15 DIAGNOSIS — Z9049 Acquired absence of other specified parts of digestive tract: Secondary | ICD-10-CM | POA: Insufficient documentation

## 2015-05-15 DIAGNOSIS — Z8639 Personal history of other endocrine, nutritional and metabolic disease: Secondary | ICD-10-CM | POA: Insufficient documentation

## 2015-05-15 DIAGNOSIS — Z880 Allergy status to penicillin: Secondary | ICD-10-CM | POA: Insufficient documentation

## 2015-05-15 DIAGNOSIS — Z8739 Personal history of other diseases of the musculoskeletal system and connective tissue: Secondary | ICD-10-CM | POA: Diagnosis not present

## 2015-05-15 DIAGNOSIS — Z8679 Personal history of other diseases of the circulatory system: Secondary | ICD-10-CM | POA: Diagnosis not present

## 2015-05-15 DIAGNOSIS — R1084 Generalized abdominal pain: Secondary | ICD-10-CM

## 2015-05-15 DIAGNOSIS — Z8701 Personal history of pneumonia (recurrent): Secondary | ICD-10-CM | POA: Insufficient documentation

## 2015-05-15 DIAGNOSIS — R1033 Periumbilical pain: Secondary | ICD-10-CM | POA: Diagnosis present

## 2015-05-15 LAB — CBC WITH DIFFERENTIAL/PLATELET
BASOS ABS: 0 10*3/uL (ref 0.0–0.1)
BASOS PCT: 0 % (ref 0–1)
EOS ABS: 0.1 10*3/uL (ref 0.0–0.7)
EOS PCT: 1 % (ref 0–5)
HCT: 41.6 % (ref 36.0–46.0)
Hemoglobin: 14.1 g/dL (ref 12.0–15.0)
Lymphocytes Relative: 29 % (ref 12–46)
Lymphs Abs: 3.3 10*3/uL (ref 0.7–4.0)
MCH: 29.8 pg (ref 26.0–34.0)
MCHC: 33.9 g/dL (ref 30.0–36.0)
MCV: 87.9 fL (ref 78.0–100.0)
Monocytes Absolute: 0.5 10*3/uL (ref 0.1–1.0)
Monocytes Relative: 4 % (ref 3–12)
NEUTROS ABS: 7.6 10*3/uL (ref 1.7–7.7)
Neutrophils Relative %: 66 % (ref 43–77)
Platelets: 297 10*3/uL (ref 150–400)
RBC: 4.73 MIL/uL (ref 3.87–5.11)
RDW: 13.1 % (ref 11.5–15.5)
WBC: 11.4 10*3/uL — ABNORMAL HIGH (ref 4.0–10.5)

## 2015-05-15 LAB — COMPREHENSIVE METABOLIC PANEL
ALK PHOS: 99 U/L (ref 38–126)
ALT: 17 U/L (ref 14–54)
AST: 19 U/L (ref 15–41)
Albumin: 4.6 g/dL (ref 3.5–5.0)
Anion gap: 8 (ref 5–15)
BUN: 14 mg/dL (ref 6–20)
CHLORIDE: 105 mmol/L (ref 101–111)
CO2: 27 mmol/L (ref 22–32)
CREATININE: 0.6 mg/dL (ref 0.44–1.00)
Calcium: 10.8 mg/dL — ABNORMAL HIGH (ref 8.9–10.3)
GFR calc Af Amer: >60 mL/min (ref >60)
GFR calc non Af Amer: >60 mL/min (ref >60)
Glucose, Bld: 109 mg/dL — ABNORMAL HIGH (ref 65–99)
POTASSIUM: 3.5 mmol/L (ref 3.5–5.1)
Sodium: 140 mmol/L (ref 135–145)
Total Bilirubin: 0.4 mg/dL (ref 0.3–1.2)
Total Protein: 7.6 g/dL (ref 6.5–8.1)

## 2015-05-15 LAB — URINE MICROSCOPIC-ADD ON

## 2015-05-15 LAB — URINALYSIS, ROUTINE W REFLEX MICROSCOPIC
BILIRUBIN URINE: NEGATIVE
Glucose, UA: NEGATIVE mg/dL
Ketones, ur: NEGATIVE mg/dL
Leukocytes, UA: NEGATIVE
Nitrite: NEGATIVE
Protein, ur: NEGATIVE mg/dL
Specific Gravity, Urine: 1.005 (ref 1.005–1.030)
Urobilinogen, UA: 0.2 mg/dL (ref 0.0–1.0)
pH: 6 (ref 5.0–8.0)

## 2015-05-15 LAB — POC URINE PREG, ED: PREG TEST UR: NEGATIVE

## 2015-05-15 MED ORDER — HYDROCODONE-ACETAMINOPHEN 5-325 MG PO TABS: 1.0000 | ORAL_TABLET | Freq: Four times a day (QID) | ORAL | Status: DC | PRN

## 2015-05-15 MED ORDER — PROMETHAZINE HCL 25 MG PO TABS
25.0000 mg | ORAL_TABLET | Freq: Four times a day (QID) | ORAL | Status: DC | PRN
  Administered 2015-05-15: 25 mg via ORAL
  Filled 2015-05-15: qty 1

## 2015-05-15 MED ORDER — PROMETHAZINE HCL 25 MG PO TABS: 25.0000 mg | ORAL_TABLET | Freq: Four times a day (QID) | ORAL | Status: DC | PRN

## 2015-05-15 NOTE — ED Notes (Signed)
Patient in with complaints of abd cramping RUQ that started this am around 7:30. Reports nausea, 1 episode of vomiting. Diarrhea.

## 2015-05-15 NOTE — Discharge Instructions (Signed)
We saw you in the ER for the abdominal pain. All of our results are normal, including all labs and imaging. Kidney function is fine as well. We are not sure what is causing your abdominal pain, and recommend that you see your primary care doctor within 2-3 days for further evaluation. If your symptoms get worse, return to the ER. Take the meds as prescribed.   Abdominal Pain, Women Abdominal (stomach, pelvic, or belly) pain can be caused by many things. It is important to tell your doctor:  The location of the pain.  Does it come and go or is it present all the time?  Are there things that start the pain (eating certain foods, exercise)?  Are there other symptoms associated with the pain (fever, nausea, vomiting, diarrhea)? All of this is helpful to know when trying to find the cause of the pain. CAUSES   Stomach: virus or bacteria infection, or ulcer.  Intestine: appendicitis (inflamed appendix), regional ileitis (Crohn's disease), ulcerative colitis (inflamed colon), irritable bowel syndrome, diverticulitis (inflamed diverticulum of the colon), or cancer of the stomach or intestine.  Gallbladder disease or stones in the gallbladder.  Kidney disease, kidney stones, or infection.  Pancreas infection or cancer.  Fibromyalgia (pain disorder).  Diseases of the female organs:  Uterus: fibroid (non-cancerous) tumors or infection.  Fallopian tubes: infection or tubal pregnancy.  Ovary: cysts or tumors.  Pelvic adhesions (scar tissue).  Endometriosis (uterus lining tissue growing in the pelvis and on the pelvic organs).  Pelvic congestion syndrome (female organs filling up with blood just before the menstrual period).  Pain with the menstrual period.  Pain with ovulation (producing an egg).  Pain with an IUD (intrauterine device, birth control) in the uterus.  Cancer of the female organs.  Functional pain (pain not caused by a disease, may improve without  treatment).  Psychological pain.  Depression. DIAGNOSIS  Your doctor will decide the seriousness of your pain by doing an examination.  Blood tests.  X-rays.  Ultrasound.  CT scan (computed tomography, special type of X-ray).  MRI (magnetic resonance imaging).  Cultures, for infection.  Barium enema (dye inserted in the large intestine, to better view it with X-rays).  Colonoscopy (looking in intestine with a lighted tube).  Laparoscopy (minor surgery, looking in abdomen with a lighted tube).  Major abdominal exploratory surgery (looking in abdomen with a large incision). TREATMENT  The treatment will depend on the cause of the pain.   Many cases can be observed and treated at home.  Over-the-counter medicines recommended by your caregiver.  Prescription medicine.  Antibiotics, for infection.  Birth control pills, for painful periods or for ovulation pain.  Hormone treatment, for endometriosis.  Nerve blocking injections.  Physical therapy.  Antidepressants.  Counseling with a psychologist or psychiatrist.  Minor or major surgery. HOME CARE INSTRUCTIONS   Do not take laxatives, unless directed by your caregiver.  Take over-the-counter pain medicine only if ordered by your caregiver. Do not take aspirin because it can cause an upset stomach or bleeding.  Try a clear liquid diet (broth or water) as ordered by your caregiver. Slowly move to a bland diet, as tolerated, if the pain is related to the stomach or intestine.  Have a thermometer and take your temperature several times a day, and record it.  Bed rest and sleep, if it helps the pain.  Avoid sexual intercourse, if it causes pain.  Avoid stressful situations.  Keep your follow-up appointments and tests, as your caregiver  orders.  If the pain does not go away with medicine or surgery, you may try:  Acupuncture.  Relaxation exercises (yoga, meditation).  Group therapy.  Counseling. SEEK  MEDICAL CARE IF:   You notice certain foods cause stomach pain.  Your home care treatment is not helping your pain.  You need stronger pain medicine.  You want your IUD removed.  You feel faint or lightheaded.  You develop nausea and vomiting.  You develop a rash.  You are having side effects or an allergy to your medicine. SEEK IMMEDIATE MEDICAL CARE IF:   Your pain does not go away or gets worse.  You have a fever.  Your pain is felt only in portions of the abdomen. The right side could possibly be appendicitis. The left lower portion of the abdomen could be colitis or diverticulitis.  You are passing blood in your stools (bright red or black tarry stools, with or without vomiting).  You have blood in your urine.  You develop chills, with or without a fever.  You pass out. MAKE SURE YOU:   Understand these instructions.  Will watch your condition.  Will get help right away if you are not doing well or get worse. Document Released: 08/25/2007 Document Revised: 03/14/2014 Document Reviewed: 09/14/2009 [facility] Patient Information 2015 [place], [place]. This information is not intended to replace advice given to you by your health care provider. Make sure you discuss any questions you have with your health care provider.  Diarrhea Diarrhea is frequent loose and watery bowel movements. It can cause you to feel weak and dehydrated. Dehydration can cause you to become tired and thirsty, have a dry mouth, and have decreased urination that often is dark yellow. Diarrhea is a sign of another problem, most often an infection that will not last long. In most cases, diarrhea typically lasts 2-3 days. However, it can last longer if it is a sign of something more serious. It is important to treat your diarrhea as directed by your caregiver to lessen or prevent future episodes of diarrhea. CAUSES  Some common causes include:  Gastrointestinal infections caused by viruses, bacteria,  or parasites.  Food poisoning or food allergies.  Certain medicines, such as antibiotics, chemotherapy, and laxatives.  Artificial sweeteners and fructose.  Digestive disorders. HOME CARE INSTRUCTIONS  Ensure adequate fluid intake (hydration): Have 1 cup (8 oz) of fluid for each diarrhea episode. Avoid fluids that contain simple sugars or sports drinks, fruit juices, whole milk products, and sodas. Your urine should be clear or pale yellow if you are drinking enough fluids. Hydrate with an oral rehydration solution that you can purchase at pharmacies, retail stores, and online. You can prepare an oral rehydration solution at home by mixing the following ingredients together:   - tsp table salt.   tsp baking soda.   tsp salt substitute containing potassium chloride.  1  tablespoons sugar.  1 L (34 oz) of water.  Certain foods and beverages may increase the speed at which food moves through the gastrointestinal (GI) tract. These foods and beverages should be avoided and include:  Caffeinated and alcoholic beverages.  High-fiber foods, such as raw fruits and vegetables, nuts, seeds, and whole grain breads and cereals.  Foods and beverages sweetened with sugar alcohols, such as xylitol, sorbitol, and mannitol.  Some foods may be well tolerated and may help thicken stool including:  Starchy foods, such as rice, toast, pasta, low-sugar cereal, oatmeal, grits, baked potatoes, crackers, and bagels.  Bananas.  Applesauce.  Add probiotic-rich foods to help increase healthy bacteria in the GI tract, such as yogurt and fermented milk products.  Wash your hands well after each diarrhea episode.  Only take over-the-counter or prescription medicines as directed by your caregiver.  Take a warm bath to relieve any burning or pain from frequent diarrhea episodes. SEEK IMMEDIATE MEDICAL CARE IF:   You are unable to keep fluids down.  You have persistent vomiting.  You have blood in  your stool, or your stools are black and tarry.  You do not urinate in 6-8 hours, or there is only a small amount of very dark urine.  You have abdominal pain that increases or localizes.  You have weakness, dizziness, confusion, or light-headedness.  You have a severe headache.  Your diarrhea gets worse or does not get better.  You have a fever or persistent symptoms for more than 2-3 days.  You have a fever and your symptoms suddenly get worse. MAKE SURE YOU:   Understand these instructions.  Will watch your condition.  Will get help right away if you are not doing well or get worse. Document Released: 10/18/2002 Document Revised: 03/14/2014 Document Reviewed: 07/05/2012 [facility] Patient Information 2015 [place], [place]. This information is not intended to replace advice given to you by your health care provider. Make sure you discuss any questions you have with your health care provider.

## 2015-05-15 NOTE — ED Provider Notes (Signed)
CSN: [id number]     Arrival date & time 05/15/15  1517 History   First MD Initiated Contact with Patient 05/15/15 1618     Chief Complaint  Patient presents with  . Abdominal Cramping  . Diarrhea  . Nausea     (Consider location/radiation/quality/duration/timing/severity/associated sxs/prior Treatment) HPI Comments: PT comes in with cc of abd pain, nausea, diarrhea. PT started having generalized abd pain this morning, and the pain has been constant since then. Pain is described as cramping, and it is periumbilical and right sided. Pt is s/p hysterectomy and cholecystectomy. She reports no pain like this in the past. PT has associated nausea and loose BM x 6 times, no blood. No one in the house with similar sx, no suspicious po intake. Pt denies any recent travel hx. Pt has no other significant medical problems, or gyne problems. She denies uti like sx, vaginal bleeding or discharge.   ROS 10 Systems reviewed and are negative for acute change except as noted in the HPI.     Patient is a 54 y.o. female presenting with cramps and diarrhea. The history is provided by the patient.  Abdominal Cramping Associated symptoms include abdominal pain.  Diarrhea Associated symptoms: abdominal pain     Past Medical History  Diagnosis Date  . Hypertrophic pyloric stenosis   . GERD (gastroesophageal reflux disease)   . Anemia   . Arthritis   . Heart murmur   . PONV (postoperative nausea and vomiting)     HA post anesthesia  . Primary hyperparathyroidism 12/19/2011    Underwent removal of a hyperplastic left superior parathyroid March 2013, with improvement in symptoms   . 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 Abdominal pain, RLQ 09/03/2011    Patient with long history of abdominal pain 1999-2006, previously with pain in RLQ, told she would "need surgery" in [place] before moving to [place] in 2008.  Never had this surgery that had been  recommended.  S/p ccy, TAH c BSO for fibromas (no cancer history).   . Headache(784.0)   . Fibromyalgia   . Gallstones   . IBS (irritable bowel syndrome)   . Pneumonia   . Non-celiac gluten sensitivity    Past Surgical History  Procedure Laterality Date  . Cholecystectomy  1998  . Parathyroidectomy  01/21/2012    Procedure: PARATHYROIDECTOMY;  Surgeon: Haywood Lasso, MD;  Location: [facility];  Service: General;  Laterality: N/A;  Left superior parathyroidectomy  . Breast cyst excision Left   . Abdominal hysterectomy  2002   Family History  Problem Relation Age of Onset  . Heart disease Daughter   . Heart disease Maternal Grandmother   . Heart disease Paternal Grandmother   . Anesthesia problems Neg Hx   . Prostate cancer Father   . Colon polyps Mother   . Colon polyps Brother   . Other Daughter 35    colon obstruction-deceased age 23  . Other Daughter     gluten sensitive  . Ulcerative colitis Mother   . Diabetes Mother   . Diabetes Brother   . Irritable bowel syndrome Mother   . Kidney disease Mother    History  Substance Use Topics  . Smoking status: Never Smoker   . Smokeless tobacco: Never Used  . Alcohol Use: No   OB History    No data available     Review of Systems  Gastrointestinal: Positive for nausea, abdominal pain and  diarrhea.  All other systems reviewed and are negative.     Allergies  Demerol; Imitrex; Iodine; Penicillins; Tramadol; Ciprofloxacin; Zofran; and Morphine and related  Home Medications   Prior to Admission medications   Medication Sig Start Date End Date Taking? Authorizing Provider  acetaminophen (TYLENOL) 325 MG tablet Take 650 mg by mouth every 6 (six) hours as needed for mild pain.   Yes Historical Provider, MD  PEG 3350-KCl-NaBcb-NaCl-NaSulf (PEG 3350/ELECTROLYTES) 240 G SOLR Take 8 oz by mouth every 15 (fifteen) minutes. Patient not taking: Reported on 05/15/2015 12/19/34   Delora Fuel, MD  promethazine (PHENERGAN) 25 MG tablet  Take 1 tablet (25 mg total) by mouth every 6 (six) hours as needed for nausea. 05/15/15   Twana Wileman, MD   BP 131/77 mmHg  Pulse 92  Temp(Src) 98.6 F (37 C) (Oral)  Resp 18  SpO2 99% Physical Exam  Constitutional: She is oriented to person, place, and time. She appears well-developed and well-nourished.  HENT:  Head: Normocephalic and atraumatic.  Eyes: EOM are normal. Pupils are equal, round, and reactive to light.  Neck: Neck supple.  Cardiovascular: Normal rate, regular rhythm and normal heart sounds.   No murmur heard. Pulmonary/Chest: Effort normal. No respiratory distress.  Abdominal: Soft. She exhibits no distension. There is tenderness. There is no rebound and no guarding.  Generalized tenderness, worst on the right side and epigastrium. Neg murphy's and mcburneys.  Neurological: She is alert and oriented to person, place, and time.  Skin: Skin is warm and dry.  Nursing note and vitals reviewed.   ED Course  Procedures (including critical care time) Labs Review Labs Reviewed  CBC WITH DIFFERENTIAL/PLATELET - Abnormal; Notable for the following:    WBC 11.4 (*)    All other components within normal limits  COMPREHENSIVE METABOLIC PANEL - Abnormal; Notable for the following:    Glucose, Bld 109 (*)    Calcium 10.8 (*)    All other components within normal limits  URINALYSIS, ROUTINE W REFLEX MICROSCOPIC (NOT AT [facility]) - Abnormal; Notable for the following:    Hgb urine dipstick SMALL (*)    All other components within normal limits  URINE MICROSCOPIC-ADD ON  POC URINE PREG, ED    Imaging Review Dg Abd Acute W/chest  05/15/2015   CLINICAL DATA:  Acute onset of generalized abdominal pain, nausea, vomiting and diarrhea. Initial encounter.  EXAM: DG ABDOMEN ACUTE W/ 1V CHEST  COMPARISON:  CT of the abdomen and pelvis from 11/16/2014, and chest radiograph performed 10/18/2013  FINDINGS: The lungs are well-aerated and clear. There is no evidence of focal opacification,  pleural effusion or pneumothorax. The cardiomediastinal silhouette is within normal limits.  The visualized bowel gas pattern is unremarkable. Scattered fluid and air are seen within the colon; there is no evidence of small bowel dilatation to suggest obstruction. No free intra-abdominal air is identified on the provided upright view. Clips are noted within the right upper quadrant, reflecting prior cholecystectomy. Additional clips are seen scattered about the right side of the abdomen.  No acute osseous abnormalities are seen; the sacroiliac joints are unremarkable in appearance.  IMPRESSION: 1. Unremarkable bowel gas pattern; no free intra-abdominal air seen. The colon contains a small amount of fluid and air. 2. No acute cardiopulmonary process identified.   Electronically Signed   By: Garald Balding M.D.   On: 05/15/2015 17:48     EKG Interpretation None      MDM   Final diagnoses:  Generalized abdominal  pain  Diarrhea  Nausea    Pt comes in with cc of abd pain. Abd pain is atypical and not focal. There is no peritoneal signs. Pt has hx of abd surgery - AAS ordered and is neg and suspicion for SBO exist, but is low. No signs of appendicitis - early appy is possible. No red flags with diarrhea.  After all the results came back, we had a discussion with the patient, and she prefers conservative measures - with pain control and nausea control. Strict return precautions discussed.     Varney Biles, MD 05/15/15 1944

## 2015-05-15 NOTE — ED Notes (Signed)
Patient is currently waiting on perscription by Dr Kathrynn Humble

## 2015-05-15 NOTE — ED Notes (Signed)
Pt reports pain as 8/10 but does not want pain medication at this time.  MD aware of pt's wishes.

## 2015-11-22 IMAGING — CR DG ABDOMEN ACUTE W/ 1V CHEST
3 series · 3 of 3 positions shown · non-contrast
Comparison: CT of the abdomen and pelvis from 11/16/2014, and chest
radiograph performed 10/18/2013

CLINICAL DATA: Acute onset of generalized abdominal pain, nausea,
vomiting and diarrhea. Initial encounter.

EXAM:
DG ABDOMEN ACUTE W/ 1V CHEST

[w chest pa]
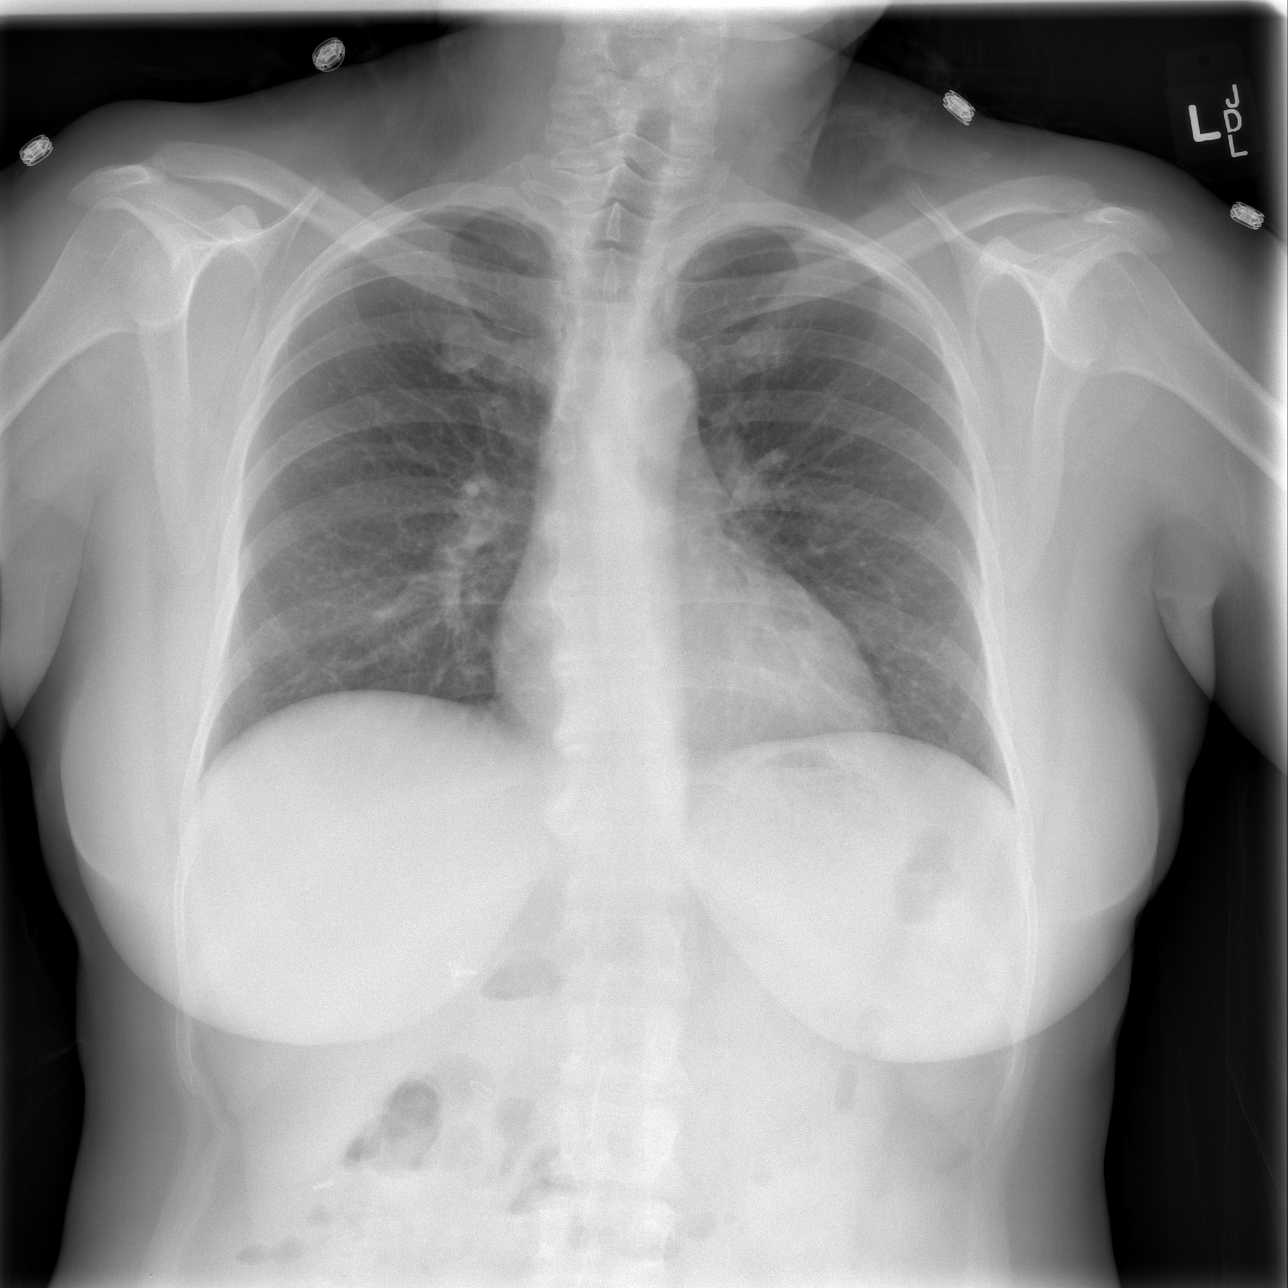

[w abdomen upright *]
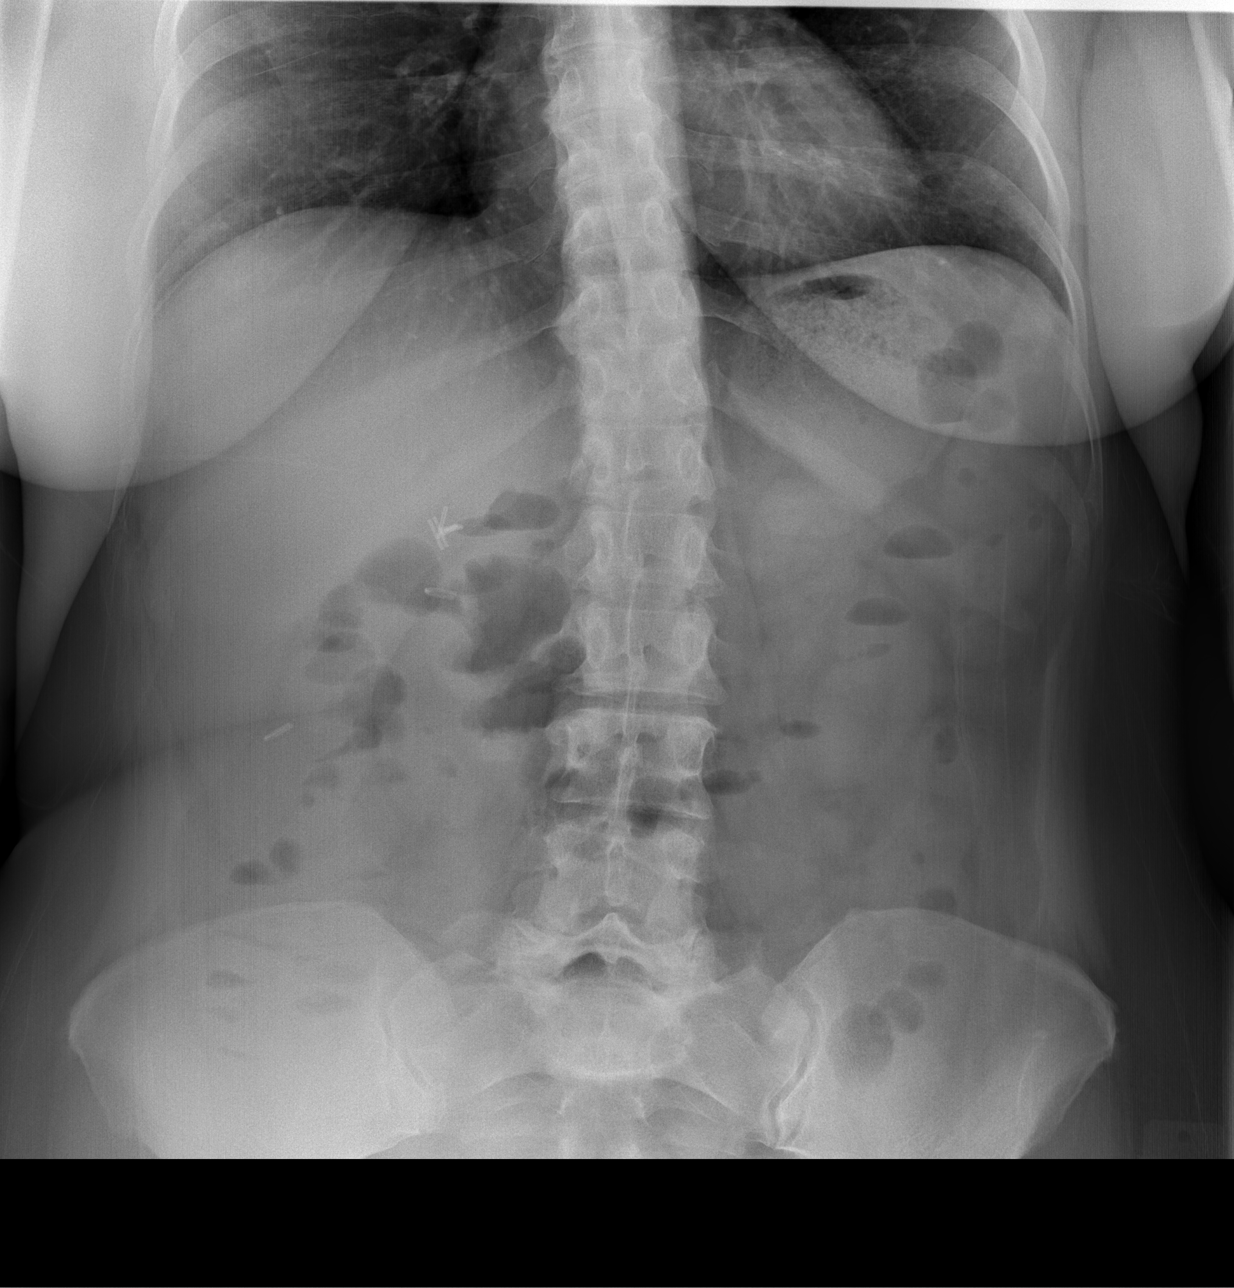

[t abdomen supine]
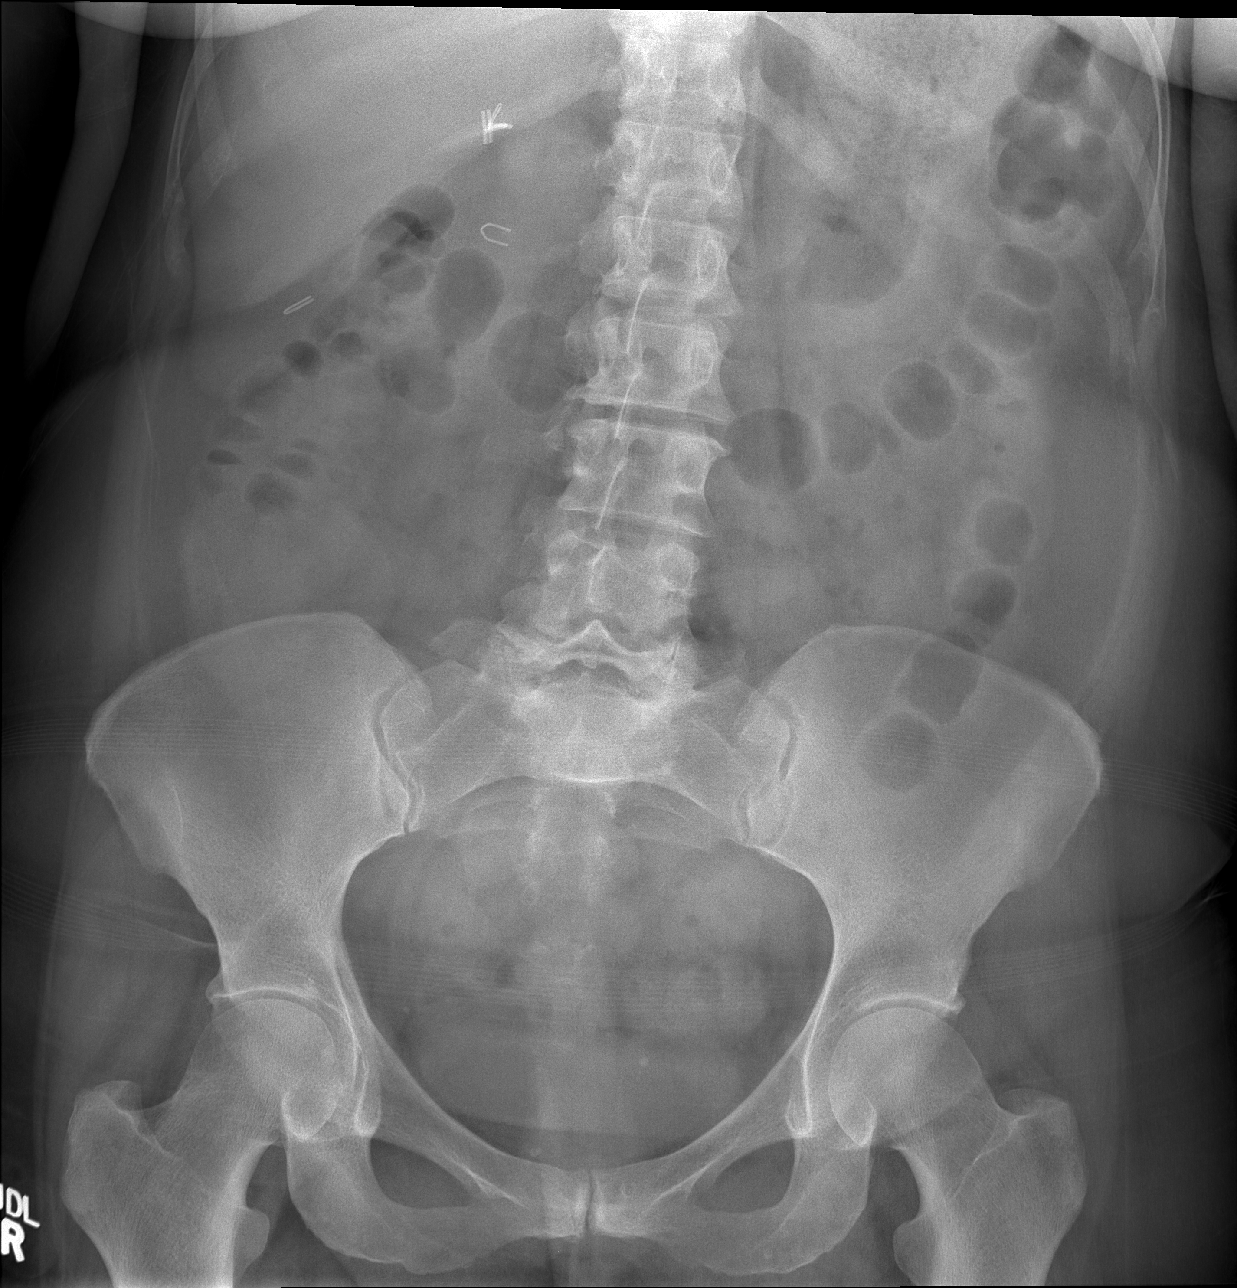

[3 of 3 positions shown; findings below may reference images not displayed]

FINDINGS: The lungs are well-aerated and clear. There is no evidence of focal
opacification, pleural effusion or pneumothorax. The
cardiomediastinal silhouette is within normal limits.

The visualized bowel gas pattern is unremarkable. Scattered fluid
and air are seen within the colon; there is no evidence of small
bowel dilatation to suggest obstruction. No free intra-abdominal air
is identified on the provided upright view. Clips are noted within
the right upper quadrant, reflecting prior cholecystectomy.
Additional clips are seen scattered about the right side of the
abdomen.

No acute osseous abnormalities are seen; the sacroiliac joints are
unremarkable in appearance.
IMPRESSION: 1. Unremarkable bowel gas pattern; no free intra-abdominal air seen.
The colon contains a small amount of fluid and air.
2. No acute cardiopulmonary process identified.

## 2015-12-19 ENCOUNTER — Ambulatory Visit: Payer: Commercial Managed Care - PPO | Admitting: Cardiovascular Disease

## 2016-01-04 ENCOUNTER — Ambulatory Visit
Admission: RE | Admit: 2016-01-04 | Discharge: 2016-01-04 | Disposition: A | Discharge status: Home / Self Care | Payer: Commercial Managed Care - PPO | Source: Ambulatory Visit | Attending: Cardiology | Admitting: Cardiology

## 2016-01-04 ENCOUNTER — Other Ambulatory Visit: Payer: Self-pay | Admitting: Cardiology

## 2016-01-04 DIAGNOSIS — R0789 Other chest pain: Secondary | ICD-10-CM

## 2016-03-21 ENCOUNTER — Other Ambulatory Visit: Payer: Self-pay

## 2016-03-21 DIAGNOSIS — Z1231 Encounter for screening mammogram for malignant neoplasm of breast: Secondary | ICD-10-CM

## 2016-12-10 ENCOUNTER — Ambulatory Visit (HOSPITAL_COMMUNITY)
Admission: EM | Admit: 2016-12-10 | Discharge: 2016-12-10 | Disposition: A | Discharge status: Home / Self Care | Payer: Commercial Managed Care - PPO | Attending: Family Medicine | Admitting: Family Medicine

## 2016-12-10 ENCOUNTER — Encounter (HOSPITAL_COMMUNITY): Payer: Self-pay | Admitting: Emergency Medicine

## 2016-12-10 DIAGNOSIS — G43009 Migraine without aura, not intractable, without status migrainosus: Secondary | ICD-10-CM

## 2016-12-10 DIAGNOSIS — J111 Influenza due to unidentified influenza virus with other respiratory manifestations: Secondary | ICD-10-CM

## 2016-12-10 DIAGNOSIS — R69 Illness, unspecified: Secondary | ICD-10-CM | POA: Diagnosis not present

## 2016-12-10 MED ORDER — KETOROLAC TROMETHAMINE 60 MG/2ML IM SOLN
INTRAMUSCULAR | Status: AC
  Filled 2016-12-10: qty 2

## 2016-12-10 MED ORDER — KETOROLAC TROMETHAMINE 60 MG/2ML IM SOLN
60.0000 mg | Freq: Once | INTRAMUSCULAR | Status: AC
  Administered 2016-12-10: 60 mg via INTRAMUSCULAR

## 2016-12-10 MED ORDER — METOCLOPRAMIDE HCL 10 MG PO TABS: 10.0000 mg | ORAL_TABLET | Freq: Once | ORAL | Status: DC

## 2016-12-10 MED ORDER — DEXAMETHASONE SODIUM PHOSPHATE 10 MG/ML IJ SOLN
INTRAMUSCULAR | Status: AC
  Filled 2016-12-10: qty 1

## 2016-12-10 MED ORDER — METOCLOPRAMIDE HCL 5 MG/ML IJ SOLN
10.0000 mg | Freq: Once | INTRAMUSCULAR | Status: AC
  Administered 2016-12-10: 10 mg via INTRAMUSCULAR

## 2016-12-10 MED ORDER — METOCLOPRAMIDE HCL 5 MG/ML IJ SOLN
INTRAMUSCULAR | Status: AC
  Filled 2016-12-10: qty 2

## 2016-12-10 MED ORDER — KETOROLAC TROMETHAMINE 10 MG PO TABS: 10.0000 mg | ORAL_TABLET | Freq: Four times a day (QID) | ORAL | 0 refills | Status: DC | PRN

## 2016-12-10 MED ORDER — METOCLOPRAMIDE HCL 10 MG PO TABS: 10.0000 mg | ORAL_TABLET | Freq: Four times a day (QID) | ORAL | 0 refills | Status: DC

## 2016-12-10 MED ORDER — DEXAMETHASONE SODIUM PHOSPHATE 10 MG/ML IJ SOLN
10.0000 mg | Freq: Once | INTRAMUSCULAR | Status: AC
  Administered 2016-12-10: 10 mg via INTRAMUSCULAR

## 2016-12-10 NOTE — ED Provider Notes (Signed)
CSN: [id number]     Arrival date & time 12/10/16  1354 History   First MD Initiated Contact with Patient 12/10/16 1600     Chief Complaint  Patient presents with  . Influenza   (Consider location/radiation/quality/duration/timing/severity/associated sxs/prior Treatment) 56 year old female presents to clinic with three day history of headache and nausea. Headache is right sided, with light sensitivity and sound sensitivity. She also has had fever for last two days along with congestion, decreased appetite, and fatigue.   The history is provided by the patient.  Influenza    Past Medical History:  Diagnosis Date  . Abdominal pain, RLQ 09/03/2011   Patient with long history of abdominal pain 1999-2006, previously with pain in RLQ, told she would "need surgery" in [place] before moving to [place] in 2008.  Never had this surgery that had been recommended.  S/p ccy, TAH c BSO for fibromas (no cancer history).   . Anemia   . Arthritis   . Fibromyalgia   . Gallstones   . GERD (gastroesophageal reflux disease)   . Headache(784.0)   . Heart murmur   . Hypertrophic pyloric stenosis   . IBS (irritable bowel syndrome)   . Mastodynia, female 10/23/2011   Bilateral; worse on L side (Left outer inferior quadrant)   . Non-celiac gluten sensitivity   . Pneumonia   . PONV (postoperative nausea and vomiting)    HA post anesthesia  . Primary hyperparathyroidism ([place]) 12/19/2011   Underwent removal of a hyperplastic left superior parathyroid March 2013, with improvement in symptoms    Past Surgical History:  Procedure Laterality Date  . ABDOMINAL HYSTERECTOMY  2002  . BREAST CYST EXCISION Left   . CHOLECYSTECTOMY  1998  . PARATHYROIDECTOMY  01/21/2012   Procedure: PARATHYROIDECTOMY;  Surgeon: Haywood Lasso, MD;  Location: [facility];  Service: General;  Laterality: N/A;  Left superior parathyroidectomy   Family History  Problem Relation Age of Onset  . Heart disease Daughter   . Heart disease  Maternal Grandmother   . Heart disease Paternal Grandmother   . Anesthesia problems Neg Hx   . Prostate cancer Father   . Colon polyps Mother   . Colon polyps Brother   . Other Daughter 42    colon obstruction-deceased age 69  . Other Daughter     gluten sensitive  . Ulcerative colitis Mother   . Diabetes Mother   . Diabetes Brother   . Irritable bowel syndrome Mother   . Kidney disease Mother    Social History  Substance Use Topics  . Smoking status: Never Smoker  . Smokeless tobacco: Never Used  . Alcohol use No   OB History    No data available     Review of Systems  Reason unable to perform ROS: as covered in HPI.  All other systems reviewed and are negative.   Allergies  Demerol; Imitrex [sumatriptan base]; Iodine; Penicillins; Tramadol; Ciprofloxacin; Zofran [ondansetron hcl]; and Morphine and related  Home Medications   Prior to Admission medications   Medication Sig Start Date End Date Taking? Authorizing Provider  acetaminophen (TYLENOL) 325 MG tablet Take 650 mg by mouth every 6 (six) hours as needed for mild pain.    Historical Provider, MD  HYDROcodone-acetaminophen (NORCO/VICODIN) 5-325 MG per tablet Take 1 tablet by mouth every 6 (six) hours as needed for severe pain. 05/15/15   Varney Biles, MD  ketorolac (TORADOL) 10 MG tablet Take 1 tablet (10 mg total) by mouth every 6 (six) hours  as needed. 12/10/16   Barnet Glasgow, NP  metoCLOPramide (REGLAN) 10 MG tablet Take 1 tablet (10 mg total) by mouth every 6 (six) hours. 12/10/16   Barnet Glasgow, NP  PEG 3350-KCl-NaBcb-NaCl-NaSulf (PEG 3350/ELECTROLYTES) 240 G SOLR Take 8 oz by mouth every 15 (fifteen) minutes. Patient not taking: Reported on 05/15/2015 [id number]   Delora Fuel, MD  promethazine (PHENERGAN) 25 MG tablet Take 1 tablet (25 mg total) by mouth every 6 (six) hours as needed for nausea. 05/15/15   Varney Biles, MD   Meds Ordered and Administered this Visit   Medications  ketorolac (TORADOL) injection  60 mg (not administered)  dexamethasone (DECADRON) injection 10 mg (not administered)  metoCLOPramide (REGLAN) injection 10 mg (not administered)    BP 117/66 (BP Location: Left Arm)   Pulse 80   Temp 102.9 F (39.4 C) (Oral)   SpO2 99%  No data found.   Physical Exam  Constitutional: She is oriented to person, place, and time. She appears well-developed and well-nourished. She does not have a sickly appearance. She does not appear ill. No distress.  HENT:  Head: Normocephalic and atraumatic.  Right Ear: Tympanic membrane and external ear normal.  Left Ear: Tympanic membrane and external ear normal.  Nose: Rhinorrhea present. Right sinus exhibits no maxillary sinus tenderness and no frontal sinus tenderness. Left sinus exhibits no maxillary sinus tenderness and no frontal sinus tenderness.  Mouth/Throat: Uvula is midline, oropharynx is clear and moist and mucous membranes are normal. No oropharyngeal exudate. Tonsils are 2+ on the right. Tonsils are 2+ on the left.  Eyes: Pupils are equal, round, and reactive to light.  Neck: Normal range of motion. Neck supple. No JVD present.  Cardiovascular: Normal rate and regular rhythm.   Pulmonary/Chest: Effort normal and breath sounds normal. No respiratory distress. She has no wheezes.  Abdominal: Soft. Bowel sounds are normal. She exhibits no distension. There is no tenderness. There is no guarding.  Lymphadenopathy:       Head (right side): Submandibular and tonsillar adenopathy present.       Head (left side): Submandibular and tonsillar adenopathy present.    She has no cervical adenopathy.  Neurological: She is alert and oriented to person, place, and time.  Skin: Skin is warm and dry. Capillary refill takes less than 2 seconds. She is not diaphoretic.  Psychiatric: She has a normal mood and affect.  Nursing note and vitals reviewed.   Urgent Care Course     Procedures (including critical care time)  Labs Review Labs Reviewed -  No data to display  Imaging Review No results found.   Visual Acuity Review  Right Eye Distance:   Left Eye Distance:   Bilateral Distance:    Right Eye Near:   Left Eye Near:    Bilateral Near:         MDM   1. Influenza-like illness   2. Migraine without aura and without status migrainosus, not intractable   You most likely have a viral flu like illness, I advise rest, plenty of fluids and management of symptoms with over the counter medicines. For symptoms you may take Tylenol as needed every 4-6 hours for body aches or fever, not to exceed 4,000 mg a day, Take mucinex or mucinex DM ever 12 hours with a full glass of water, you may use an inhaled steroid such as Flonase, 2 sprays each nostril once a day for congestion, or an antihistamine such as Claritin or Zyrtec once a day. Should  your symptoms worsen or fail to resolve, follow up with your primary care provider or return to clinic.   For your migraine you have received an injection of toradol here, along with an injection of dexamethasone and an injection of Reglan for nausea. I have sent prescriptions to your pharmacy for your headache, Toradol, 1 tablet every 6 hours as needed and reglan, 1 tablet every 6 hours as needed.      Barnet Glasgow, NP 12/10/16 2003

## 2016-12-10 NOTE — Discharge Instructions (Signed)
You most likely have a viral flu like illness, I advise rest, plenty of fluids and management of symptoms with over the counter medicines. For symptoms you may take Tylenol as needed every 4-6 hours for body aches or fever, not to exceed 4,000 mg a day, Take mucinex or mucinex DM ever 12 hours with a full glass of water, you may use an inhaled steroid such as Flonase, 2 sprays each nostril once a day for congestion, or an antihistamine such as Claritin or Zyrtec once a day. Should your symptoms worsen or fail to resolve, follow up with your primary care provider or return to clinic.   For your migraine you have received an injection of toradol here, along with an injection of dexamethasone and an injection of Reglan for nausea. I have sent prescriptions to your pharmacy for your headache, Toradol, 1 tablet every 6 hours as needed and reglan, 1 tablet every 6 hours as needed.

## 2016-12-10 NOTE — ED Triage Notes (Addendum)
Pt complains of a migraine for four days.  Central chest pressure that radiates to her back and her left neck for four days, a cough for two days, and a fever starting last night.  Pt reports this same pain when she had pneumonia many years ago.

## 2016-12-13 ENCOUNTER — Emergency Department (HOSPITAL_COMMUNITY): Payer: Commercial Managed Care - PPO

## 2016-12-13 ENCOUNTER — Emergency Department (HOSPITAL_COMMUNITY)
Admission: EM | Admit: 2016-12-13 | Discharge: 2016-12-13 | Disposition: A | Discharge status: Home / Self Care | Payer: Self-pay | Attending: Emergency Medicine | Admitting: Emergency Medicine

## 2016-12-13 ENCOUNTER — Encounter (HOSPITAL_COMMUNITY): Payer: Self-pay

## 2016-12-13 DIAGNOSIS — J101 Influenza due to other identified influenza virus with other respiratory manifestations: Secondary | ICD-10-CM

## 2016-12-13 DIAGNOSIS — R0689 Other abnormalities of breathing: Secondary | ICD-10-CM | POA: Insufficient documentation

## 2016-12-13 DIAGNOSIS — J09X2 Influenza due to identified novel influenza A virus with other respiratory manifestations: Secondary | ICD-10-CM | POA: Insufficient documentation

## 2016-12-13 LAB — CBC WITH DIFFERENTIAL/PLATELET
Basophils Absolute: 0 10*3/uL (ref 0.0–0.1)
Basophils Relative: 0 %
Eosinophils Absolute: 0 10*3/uL (ref 0.0–0.7)
Eosinophils Relative: 0 %
HCT: 35.9 % — ABNORMAL LOW (ref 36.0–46.0)
Hemoglobin: 12 g/dL (ref 12.0–15.0)
LYMPHS PCT: 49 %
Lymphs Abs: 3.1 10*3/uL (ref 0.7–4.0)
MCH: 29.2 pg (ref 26.0–34.0)
MCHC: 33.4 g/dL (ref 30.0–36.0)
MCV: 87.3 fL (ref 78.0–100.0)
MONO ABS: 0.5 10*3/uL (ref 0.1–1.0)
Monocytes Relative: 8 %
Neutro Abs: 2.7 10*3/uL (ref 1.7–7.7)
Neutrophils Relative %: 43 %
PLATELETS: 277 10*3/uL (ref 150–400)
RBC: 4.11 MIL/uL (ref 3.87–5.11)
RDW: 13.6 % (ref 11.5–15.5)
WBC: 6.3 10*3/uL (ref 4.0–10.5)

## 2016-12-13 LAB — COMPREHENSIVE METABOLIC PANEL
ALT: 37 U/L (ref 14–54)
AST: 24 U/L (ref 15–41)
Albumin: 4.5 g/dL (ref 3.5–5.0)
Alkaline Phosphatase: 86 U/L (ref 38–126)
Anion gap: 7 (ref 5–15)
BUN: 11 mg/dL (ref 6–20)
CHLORIDE: 106 mmol/L (ref 101–111)
CO2: 26 mmol/L (ref 22–32)
CREATININE: 0.76 mg/dL (ref 0.44–1.00)
Calcium: 9.8 mg/dL (ref 8.9–10.3)
GFR calc Af Amer: >60 mL/min (ref >60)
Glucose, Bld: 92 mg/dL (ref 65–99)
Potassium: 3.7 mmol/L (ref 3.5–5.1)
Sodium: 139 mmol/L (ref 135–145)
TOTAL PROTEIN: 7.2 g/dL (ref 6.5–8.1)
Total Bilirubin: 0.3 mg/dL (ref 0.3–1.2)

## 2016-12-13 LAB — URINALYSIS, ROUTINE W REFLEX MICROSCOPIC
BILIRUBIN URINE: NEGATIVE
Bacteria, UA: NONE SEEN
Glucose, UA: NEGATIVE mg/dL
KETONES UR: NEGATIVE mg/dL
Leukocytes, UA: NEGATIVE
Nitrite: NEGATIVE
PH: 7 (ref 5.0–8.0)
Protein, ur: NEGATIVE mg/dL
SPECIFIC GRAVITY, URINE: 1.002 — AB (ref 1.005–1.030)
SQUAMOUS EPITHELIAL / LPF: NONE SEEN

## 2016-12-13 LAB — I-STAT TROPONIN, ED: TROPONIN I, POC: 0 ng/mL (ref 0.00–0.08)

## 2016-12-13 LAB — INFLUENZA PANEL BY PCR (TYPE A & B)
INFLAPCR: POSITIVE — AB
Influenza B By PCR: NEGATIVE

## 2016-12-13 LAB — BRAIN NATRIURETIC PEPTIDE: B NATRIURETIC PEPTIDE 5: 32.8 pg/mL (ref 0.0–100.0)

## 2016-12-13 LAB — LIPASE, BLOOD: LIPASE: 33 U/L (ref 11–51)

## 2016-12-13 MED ORDER — SODIUM CHLORIDE 0.9 % IV BOLUS (SEPSIS)
1000.0000 mL | Freq: Once | INTRAVENOUS | Status: AC
  Administered 2016-12-13: 1000 mL via INTRAVENOUS

## 2016-12-13 NOTE — ED Notes (Signed)
Patient d/c'd self care.  F/U reviewed.  Patient verbalized understanding. 

## 2016-12-13 NOTE — ED Provider Notes (Signed)
Terri Montgomery DEPT Provider Note   CSN: [id number] Arrival date & time: 12/13/16  1514     History   Chief Complaint Chief Complaint  Patient presents with  . Chest Pain  . Fever    HPI Terri Montgomery is a 56 y.o. female.  HPI 56 year old female who presents with cough, chest pain, fever and chills. Onset of cough, fever, body aches 5-6 days ago. Seen at UC 3 days ago and diagnosed with likely flu like illness and migraine headache. Discharged with reglan and mucinex. Taking these medications without improvement in symptoms and feeling worse now. Chest pain, described as constant chest pressure in the center of her chest, worse with lying down. Associated with dyspnea, especially lying down. . Fever 103F 3 days ago. Low grade temperatures since then. No nausea, vomiting, diarrhea but having epigastric and upper abdominal discomfort. No known sick contacts. No LE edema or leg pain.  Past Medical History:  Diagnosis Date  . Abdominal pain, RLQ 09/03/2011   Patient with long history of abdominal pain 1999-2006, previously with pain in RLQ, told she would "need surgery" in [place] before moving to [place] in 2008.  Never had this surgery that had been recommended.  S/p ccy, TAH c BSO for fibromas (no cancer history).   . Anemia   . Arthritis   . Fibromyalgia   . Gallstones   . GERD (gastroesophageal reflux disease)   . Headache(784.0)   . Heart murmur   . Hypertrophic pyloric stenosis   . IBS (irritable bowel syndrome)   . Mastodynia, female 10/23/2011   Bilateral; worse on L side (Left outer inferior quadrant)   . Non-celiac gluten sensitivity   . Pneumonia   . PONV (postoperative nausea and vomiting)    HA post anesthesia  . Primary hyperparathyroidism ([place]) 12/19/2011   Underwent removal of a hyperplastic left superior parathyroid March 2013, with improvement in symptoms     Patient Active Problem List   Diagnosis Date Noted  . Cough 10/18/2013  . Hematuria 10/06/2013  .  Sore throat 03/12/2013  . Myalgia 03/12/2013  . Urinary incontinence 02/02/2013  . Flank pain 01/26/2013  . Dysuria 09/25/2012  . Left arm weakness 03/30/2012  . Facial droop 03/30/2012  . Chest pain 03/30/2012  . Headache(784.0) 03/30/2012  . Blurred vision 03/30/2012  . Fatigue 10/23/2011  . Abdominal pain, RLQ 09/03/2011    Past Surgical History:  Procedure Laterality Date  . ABDOMINAL HYSTERECTOMY  2002  . BREAST CYST EXCISION Left   . CHOLECYSTECTOMY  1998  . PARATHYROIDECTOMY  01/21/2012   Procedure: PARATHYROIDECTOMY;  Surgeon: Haywood Lasso, MD;  Location: [facility] OR;  Service: General;  Laterality: N/A;  Left superior parathyroidectomy    OB History    No data available       Home Medications    Prior to Admission medications   Medication Sig Start Date End Date Taking? Authorizing Provider  ketorolac (TORADOL) 10 MG tablet Take 1 tablet (10 mg total) by mouth every 6 (six) hours as needed. Patient not taking: Reported on 12/13/2016 12/10/16   Barnet Glasgow, NP  metoCLOPramide (REGLAN) 10 MG tablet Take 1 tablet (10 mg total) by mouth every 6 (six) hours. Patient not taking: Reported on 12/13/2016 12/10/16   Barnet Glasgow, NP    Family History Family History  Problem Relation Age of Onset  . Colon polyps Mother   . Ulcerative colitis Mother   . Diabetes Mother   . Irritable bowel syndrome Mother   .  Kidney disease Mother   . Prostate cancer Father   . Heart disease Daughter   . Heart disease Maternal Grandmother   . Heart disease Paternal Grandmother   . Colon polyps Brother   . Other Daughter 72    colon obstruction-deceased age 40  . Other Daughter     gluten sensitive  . Diabetes Brother   . Anesthesia problems Neg Hx     Social History Social History  Substance Use Topics  . Smoking status: Never Smoker  . Smokeless tobacco: Never Used  . Alcohol use No     Allergies   Demerol; Imitrex [sumatriptan base]; Iodine; Penicillins; Tramadol;  Ciprofloxacin; Zofran [ondansetron hcl]; and Morphine and related   Review of Systems Review of Systems 10/14 systems reviewed and are negative other than those stated in the HPI   Physical Exam Updated Vital Signs BP 120/80   Pulse 65   Temp 98.3 F (36.8 C) (Oral)   Resp 22   Ht 5\' 2"  (1.575 m)   Wt 148 lb (67.1 kg)   SpO2 99%   BMI 27.07 kg/m   Physical Exam Physical Exam  Nursing note and vitals reviewed. Constitutional: Non-toxic, and in no acute distress Head: Normocephalic and atraumatic.  Mouth/Throat: Oropharynx is clear and moist.  Neck: Normal range of motion. Neck supple. no meningismus Cardiovascular: Normal rate and regular rhythm.   Pulmonary/Chest: Effort normal and breath sounds normal. no chest wall tenderness. Abdominal: Soft. There is minimal upper abdominal tenderness. There is no rebound and no guarding.  Musculoskeletal: Normal range of motion.  Neurological: Alert, no facial droop, fluent speech, moves all extremities symmetrically Skin: Skin is warm and dry.  Psychiatric: Cooperative   ED Treatments / Results  Labs (all labs ordered are listed, but only abnormal results are displayed) Labs Reviewed  CBC WITH DIFFERENTIAL/PLATELET - Abnormal; Notable for the following:       Result Value   HCT 35.9 (*)    All other components within normal limits  URINALYSIS, ROUTINE W REFLEX MICROSCOPIC - Abnormal; Notable for the following:    Color, Urine COLORLESS (*)    Specific Gravity, Urine 1.002 (*)    Hgb urine dipstick SMALL (*)    All other components within normal limits  INFLUENZA PANEL BY PCR (TYPE A & B) - Abnormal; Notable for the following:    Influenza A By PCR POSITIVE (*)    All other components within normal limits  COMPREHENSIVE METABOLIC PANEL  LIPASE, BLOOD  BRAIN NATRIURETIC PEPTIDE  I-STAT TROPOININ, ED    EKG  EKG Interpretation  Date/Time:  Friday December 13 2016 15:26:09 EST Ventricular Rate:  64 PR Interval:      QRS Duration: 74 QT Interval:  387 QTC Calculation: 400 R Axis:   17 Text Interpretation:  Sinus rhythm Baseline wander in lead(s) V2 V5 similar to prior EKG  Confirmed by Analeigha Nauman MD, Davian Wollenberg [phone]) on 12/13/2016 3:33:17 PM       Radiology Dg Chest 2 View  Result Date: 12/13/2016 CLINICAL DATA:  Flu-like symptoms for 1 week. Productive cough, congestion, chest pain, fever, chills, abdominal pain. POCT EXAM: CHEST  2 VIEW COMPARISON:  01/04/2016 FINDINGS: Shallow lung inflation. Heart size is normal. There is mild perihilar infiltrate bilaterally which may be accentuated by the shallow inflation. No evidence for pulmonary edema or focal consolidations. No pleural effusions. IMPRESSION: 1. Shallow inflation. 2. Mild perihilar infiltrates. Electronically Signed   By: Nolon Nations M.D.   On: 12/13/2016 15:56  Procedures Procedures (including critical care time)  Medications Ordered in ED Medications  sodium chloride 0.9 % bolus 1,000 mL (0 mLs Intravenous Stopped 12/13/16 1707)     Initial Impression / Assessment and Plan / ED Course  I have reviewed the triage vital signs and the nursing notes.  Pertinent labs & imaging results that were available during my care of the patient were reviewed by me and considered in my medical decision making (see chart for details).     56 year old female who presents with several days of cough, fever, chest pressure. She is nontoxic in appearance but does appear to have an acute illness likely influenza. She is low energy and appears tired. Dry mucous membranes.  Exam otherwise unremarkable.  Her vital signs are within normal limits here. Blood work was initiated in triage, reviewed, and overall reassuring. Chest x-ray obtained, visualized, and shows no focal consolidation c/f pneumonia or other acute processes. Influenza positive. Troponin negative and EKG non-ischemic. No concerns for ACS, myocarditis, CHF, or other acute cardiopulmonary processes. Feels  improved with fluids. offerred tamiflu but out of 48 hr window to derive huge benefits, and she has declined this medication at this time.   Strict return and follow-up instructions reviewed. She expressed understanding of all discharge instructions and felt comfortable with the plan of care.   Final Clinical Impressions(s) / ED Diagnoses   Final diagnoses:  Influenza A    New Prescriptions New Prescriptions   No medications on file     Forde Dandy, MD 12/13/16 1815

## 2016-12-13 NOTE — Discharge Instructions (Signed)
You have the flu. Get rest. Drink fluids.follow-up with your PCP in 3-4 days for recheck  Return without fail for worsening symptoms, including persistent fevers, difficulty breathing, confusion, escalating pain, or any other symptoms concerning to you.

## 2016-12-13 NOTE — ED Triage Notes (Signed)
PT C/O CHEST PAIN, FEVER, PRODUCTIVE COUGH, AND ABDOMINAL PAIN X3 DAYS. PT STS SHE WENT TO AN UCC LAST WEEK, BUT SHE HAS NOT GOTTEN BETTER. DENIES DIARRHEA.

## 2016-12-17 ENCOUNTER — Ambulatory Visit (HOSPITAL_COMMUNITY)
Admission: EM | Admit: 2016-12-17 | Discharge: 2016-12-17 | Disposition: A | Discharge status: Home / Self Care | Payer: Commercial Managed Care - PPO | Attending: Family Medicine | Admitting: Family Medicine

## 2016-12-17 DIAGNOSIS — R319 Hematuria, unspecified: Secondary | ICD-10-CM

## 2016-12-17 DIAGNOSIS — R109 Unspecified abdominal pain: Secondary | ICD-10-CM | POA: Diagnosis not present

## 2016-12-17 DIAGNOSIS — N200 Calculus of kidney: Secondary | ICD-10-CM

## 2016-12-17 LAB — POCT URINALYSIS DIP (DEVICE)
BILIRUBIN URINE: NEGATIVE
Glucose, UA: NEGATIVE mg/dL
Ketones, ur: NEGATIVE mg/dL
Leukocytes, UA: NEGATIVE
NITRITE: NEGATIVE
PH: 6 (ref 5.0–8.0)
Protein, ur: NEGATIVE mg/dL
Specific Gravity, Urine: <=1.005 (ref 1.005–1.030)
UROBILINOGEN UA: 0.2 mg/dL (ref 0.0–1.0)

## 2016-12-17 MED ORDER — KETOROLAC TROMETHAMINE 60 MG/2ML IM SOLN
INTRAMUSCULAR | Status: AC
  Filled 2016-12-17: qty 2

## 2016-12-17 MED ORDER — TAMSULOSIN HCL 0.4 MG PO CAPS: 0.4000 mg | ORAL_CAPSULE | Freq: Every day | ORAL | 0 refills | Status: DC

## 2016-12-17 MED ORDER — KETOROLAC TROMETHAMINE 60 MG/2ML IM SOLN
60.0000 mg | Freq: Once | INTRAMUSCULAR | Status: AC
  Administered 2016-12-17: 60 mg via INTRAMUSCULAR

## 2016-12-17 MED ORDER — HYDROCODONE-ACETAMINOPHEN 5-325 MG PO TABS: 1.0000 | ORAL_TABLET | ORAL | 0 refills | Status: DC | PRN

## 2016-12-17 NOTE — ED Provider Notes (Signed)
CSN: [id number]     Arrival date & time 12/17/16  1601 History   First MD Initiated Contact with Patient 12/17/16 1712     Chief Complaint  Patient presents with  . Urinary Tract Infection   (Consider location/radiation/quality/duration/timing/severity/associated sxs/prior Treatment) C/o severe pain right flank which radiates into her right abdomen.  She has been having urinary frequency.   The history is provided by the patient.  Urinary Tract Infection  Pain quality:  Aching, sharp and stabbing Pain severity:  Severe Onset quality:  Sudden Duration:  1 day Timing:  Intermittent Progression:  Worsening Chronicity:  New Recent urinary tract infections: no   Relieved by:  None tried Worsened by:  Nothing Ineffective treatments:  None tried Associated symptoms: abdominal pain and flank pain     Past Medical History:  Diagnosis Date  . Abdominal pain, RLQ 09/03/2011   Patient with long history of abdominal pain 1999-2006, previously with pain in RLQ, told she would "need surgery" in [place] before moving to [place] in 2008.  Never had this surgery that had been recommended.  S/p ccy, TAH c BSO for fibromas (no cancer history).   . Anemia   . Arthritis   . Fibromyalgia   . Gallstones   . GERD (gastroesophageal reflux disease)   . Headache(784.0)   . Heart murmur   . Hypertrophic pyloric stenosis   . IBS (irritable bowel syndrome)   . Mastodynia, female 10/23/2011   Bilateral; worse on L side (Left outer inferior quadrant)   . Non-celiac gluten sensitivity   . Pneumonia   . PONV (postoperative nausea and vomiting)    HA post anesthesia  . Primary hyperparathyroidism ([place]) 12/19/2011   Underwent removal of a hyperplastic left superior parathyroid March 2013, with improvement in symptoms    Past Surgical History:  Procedure Laterality Date  . ABDOMINAL HYSTERECTOMY  2002  . BREAST CYST EXCISION Left   . CHOLECYSTECTOMY  1998  . PARATHYROIDECTOMY  01/21/2012   Procedure:  PARATHYROIDECTOMY;  Surgeon: Haywood Lasso, MD;  Location: [facility] OR;  Service: General;  Laterality: N/A;  Left superior parathyroidectomy   Family History  Problem Relation Age of Onset  . Colon polyps Mother   . Ulcerative colitis Mother   . Diabetes Mother   . Irritable bowel syndrome Mother   . Kidney disease Mother   . Prostate cancer Father   . Heart disease Daughter   . Heart disease Maternal Grandmother   . Heart disease Paternal Grandmother   . Colon polyps Brother   . Other Daughter 60    colon obstruction-deceased age 71  . Other Daughter     gluten sensitive  . Diabetes Brother   . Anesthesia problems Neg Hx    Social History  Substance Use Topics  . Smoking status: Never Smoker  . Smokeless tobacco: Never Used  . Alcohol use No   OB History    No data available     Review of Systems  Constitutional: Negative.   HENT: Negative.   Eyes: Negative.   Respiratory: Negative.   Cardiovascular: Negative.   Gastrointestinal: Positive for abdominal pain.  Endocrine: Negative.   Genitourinary: Positive for flank pain.  Allergic/Immunologic: Negative.   Neurological: Negative.   Hematological: Negative.   Psychiatric/Behavioral: Negative.     Allergies  Demerol; Imitrex [sumatriptan base]; Iodine; Penicillins; Tramadol; Ciprofloxacin; Zofran [ondansetron hcl]; and Morphine and related  Home Medications   Prior to Admission medications   Medication Sig Start Date End Date  Taking? Authorizing Provider  HYDROcodone-acetaminophen (NORCO/VICODIN) 5-325 MG tablet Take 1-2 tablets by mouth every 4 (four) hours as needed. 12/17/16   Lysbeth Penner, FNP  ketorolac (TORADOL) 10 MG tablet Take 1 tablet (10 mg total) by mouth every 6 (six) hours as needed. Patient not taking: Reported on 12/13/2016 12/10/16   Barnet Glasgow, NP  metoCLOPramide (REGLAN) 10 MG tablet Take 1 tablet (10 mg total) by mouth every 6 (six) hours. Patient not taking: Reported on 12/13/2016 12/10/16    Barnet Glasgow, NP  tamsulosin (FLOMAX) 0.4 MG CAPS capsule Take 1 capsule (0.4 mg total) by mouth daily. 12/17/16   Lysbeth Penner, FNP   Meds Ordered and Administered this Visit   Medications  ketorolac (TORADOL) injection 60 mg (not administered)    BP 132/77 (BP Location: Right Arm)   Pulse (!) 57   Temp 98.5 F (36.9 C) (Oral)   Resp 16   SpO2 100%  No data found.   Physical Exam  Constitutional: She appears well-developed and well-nourished.  Bent over in severe pain  HENT:  Head: Normocephalic and atraumatic.  Eyes: EOM are normal. Pupils are equal, round, and reactive to light.  Neck: Normal range of motion.  Cardiovascular: Normal rate, regular rhythm and normal heart sounds.   Pulmonary/Chest: Effort normal and breath sounds normal.  Abdominal: Soft. There is tenderness.  Genitourinary:  Genitourinary Comments: Right CVA tenderness  Nursing note and vitals reviewed.   Urgent Care Course     Procedures (including critical care time)  Labs Review Labs Reviewed  POCT URINALYSIS DIP (DEVICE) - Abnormal; Notable for the following:       Result Value   Hgb urine dipstick TRACE (*)    All other components within normal limits    Imaging Review No results found.   Visual Acuity Review  Right Eye Distance:   Left Eye Distance:   Bilateral Distance:    Right Eye Near:   Left Eye Near:    Bilateral Near:         MDM   1. Kidney stone   2. Right flank pain   3. Hematuria, unspecified type    flomax 0.4mg  one po qd #7 norco 5/325 one po q 6 hours prn #12  Push po fluids and Return Precautions explained. If unable to void or if pain severe then go to ED.     Lysbeth Penner, FNP 12/17/16 [phone]

## 2016-12-17 NOTE — ED Triage Notes (Signed)
C/o flank pain, urinating frequently States abd was bloated States she feels nausea

## 2017-09-29 ENCOUNTER — Encounter (HOSPITAL_COMMUNITY): Payer: Self-pay | Admitting: Emergency Medicine

## 2017-09-29 ENCOUNTER — Other Ambulatory Visit: Payer: Self-pay

## 2017-09-29 ENCOUNTER — Ambulatory Visit (INDEPENDENT_AMBULATORY_CARE_PROVIDER_SITE_OTHER): Payer: Self-pay

## 2017-09-29 ENCOUNTER — Ambulatory Visit (HOSPITAL_COMMUNITY)
Admission: EM | Admit: 2017-09-29 | Discharge: 2017-09-29 | Disposition: A | Discharge status: Home / Self Care | Payer: Self-pay | Attending: Family Medicine | Admitting: Family Medicine

## 2017-09-29 DIAGNOSIS — J40 Bronchitis, not specified as acute or chronic: Secondary | ICD-10-CM

## 2017-09-29 MED ORDER — CETIRIZINE-PSEUDOEPHEDRINE ER 5-120 MG PO TB12: 1.0000 | ORAL_TABLET | Freq: Every day | ORAL | 0 refills | Status: DC

## 2017-09-29 MED ORDER — FLUTICASONE PROPIONATE 50 MCG/ACT NA SUSP: 2.0000 | Freq: Every day | NASAL | 0 refills | Status: DC

## 2017-09-29 MED ORDER — AZITHROMYCIN 250 MG PO TABS: 250.0000 mg | ORAL_TABLET | Freq: Every day | ORAL | 0 refills | Status: DC

## 2017-09-29 NOTE — Discharge Instructions (Signed)
Chest xray negative for pneumonia. Start azithromycin as directed. Start flonase, zyrtec-D for nasal congestion. You can use over the counter nasal saline rinse such as neti pot for nasal congestion. Keep hydrated, your urine should be clear to pale yellow in color. Tylenol/motrin for fever and pain. Monitor for any worsening of symptoms, chest pain, shortness of breath, wheezing, swelling of the throat, follow up for reevaluation.

## 2017-09-29 NOTE — ED Provider Notes (Signed)
[place]    CSN: [id number] Arrival date & time: 09/29/17  1222     History   Chief Complaint Chief Complaint  Patient presents with  . URI    HPI Terri Montgomery is a 56 y.o. female.   56 year old female comes in with 2 week history of nasal drainage, cough, sore throat, chest discomfort, chills. Denies fever. Productive cough, states it is blood tinged. Denies ear pain, eye pain. otc dayquil, tylenol, homeopathic medicine without relief. States hard to take deep breath. Chest and back pressure that is constant without aggravating and alleviating factor. Never smoker. Denies personal or family history of heart disease. States that she was recently worked up for chest pain and results were negative.       Past Medical History:  Diagnosis Date  . Abdominal pain, RLQ 09/03/2011   Patient with long history of abdominal pain 1999-2006, previously with pain in RLQ, told she would "need surgery" in [place] before moving to [place] in 2008.  Never had this surgery that had been recommended.  S/p ccy, TAH c BSO for fibromas (no cancer history).   . Anemia   . Arthritis   . Fibromyalgia   . Gallstones   . GERD (gastroesophageal reflux disease)   . Headache(784.0)   . Heart murmur   . Hypertrophic pyloric stenosis   . IBS (irritable bowel syndrome)   . Mastodynia, female 10/23/2011   Bilateral; worse on L side (Left outer inferior quadrant)   . Non-celiac gluten sensitivity   . Pneumonia   . PONV (postoperative nausea and vomiting)    HA post anesthesia  . Primary hyperparathyroidism ([place]) 12/19/2011   Underwent removal of a hyperplastic left superior parathyroid March 2013, with improvement in symptoms     Patient Active Problem List   Diagnosis Date Noted  . Cough 10/18/2013  . Hematuria 10/06/2013  . Sore throat 03/12/2013  . Myalgia 03/12/2013  . Urinary incontinence 02/02/2013  . Flank pain 01/26/2013  . Dysuria 09/25/2012  . Left arm weakness 03/30/2012    . Facial droop 03/30/2012  . Chest pain 03/30/2012  . Headache(784.0) 03/30/2012  . Blurred vision 03/30/2012  . Fatigue 10/23/2011  . Abdominal pain, RLQ 09/03/2011    Past Surgical History:  Procedure Laterality Date  . ABDOMINAL HYSTERECTOMY  2002  . BREAST CYST EXCISION Left   . CHOLECYSTECTOMY  1998  . PARATHYROIDECTOMY N/A 01/21/2012   Performed by Haywood Lasso, MD at [facility]    OB History    No data available       Home Medications    Prior to Admission medications   Medication Sig Start Date End Date Taking? Authorizing Provider  azithromycin (ZITHROMAX) 250 MG tablet Take 1 tablet (250 mg total) daily by mouth. Take first 2 tablets together, then 1 every day until finished. 09/29/17   Ok Edwards, PA-C  cetirizine-pseudoephedrine (ZYRTEC-D) 5-120 MG tablet Take 1 tablet daily by mouth. 09/29/17   Advait Buice V, PA-C  fluticasone (FLONASE) 50 MCG/ACT nasal spray Place 2 sprays daily into both nostrils. 09/29/17   Tasia Catchings, Lionell Matuszak V, PA-C  ketorolac (TORADOL) 10 MG tablet Take 1 tablet (10 mg total) by mouth every 6 (six) hours as needed. Patient not taking: Reported on 12/13/2016 12/10/16   Barnet Glasgow, NP  metoCLOPramide (REGLAN) 10 MG tablet Take 1 tablet (10 mg total) by mouth every 6 (six) hours. Patient not taking: Reported on 12/13/2016 12/10/16   Barnet Glasgow, NP  Family History Family History  Problem Relation Age of Onset  . Colon polyps Mother   . Ulcerative colitis Mother   . Diabetes Mother   . Irritable bowel syndrome Mother   . Kidney disease Mother   . Prostate cancer Father   . Heart disease Daughter   . Heart disease Maternal Grandmother   . Heart disease Paternal Grandmother   . Colon polyps Brother   . Other Daughter 42       colon obstruction-deceased age 26  . Other Daughter        gluten sensitive  . Diabetes Brother   . Anesthesia problems Neg Hx     Social History Social History   Tobacco Use  . Smoking status: Never Smoker  .  Smokeless tobacco: Never Used  Substance Use Topics  . Alcohol use: No  . Drug use: No     Allergies   Demerol; Imitrex [sumatriptan base]; Iodine; Penicillins; Tramadol; Ciprofloxacin; Zofran [ondansetron hcl]; and Morphine and related   Review of Systems Review of Systems  Reason unable to perform ROS: See HPI as above.     Physical Exam Triage Vital Signs ED Triage Vitals  Enc Vitals Group     BP 09/29/17 1324 (!) 141/81     Pulse Rate 09/29/17 1324 (!) 57     Resp --      Temp 09/29/17 1324 98.3 F (36.8 C)     Temp Source 09/29/17 1324 Oral     SpO2 09/29/17 1324 100 %     Weight --      Height --      Head Circumference --      Peak Flow --      Pain Score 09/29/17 1325 9     Pain Loc --      Pain Edu? --      Excl. in [place]? --    No data found.  Updated Vital Signs BP (!) 141/81 (BP Location: Left Arm)   Pulse (!) 57   Temp 98.3 F (36.8 C) (Oral)   LMP  (Exact Date)   SpO2 100%   Physical Exam  Constitutional: She is oriented to person, place, and time. She appears well-developed and well-nourished. No distress.  HENT:  Head: Normocephalic and atraumatic.  Right Ear: External ear and ear canal normal. Tympanic membrane is erythematous. Tympanic membrane is not bulging.  Left Ear: External ear and ear canal normal. Tympanic membrane is erythematous. Tympanic membrane is not bulging.  Nose: Mucosal edema and rhinorrhea present. Right sinus exhibits no maxillary sinus tenderness and no frontal sinus tenderness. Left sinus exhibits no maxillary sinus tenderness and no frontal sinus tenderness.  Mouth/Throat: Uvula is midline, oropharynx is clear and moist and mucous membranes are normal.  Eyes: Conjunctivae are normal. Pupils are equal, round, and reactive to light.  Neck: Normal range of motion. Neck supple.  Cardiovascular: Normal rate, regular rhythm and normal heart sounds. Exam reveals no gallop and no friction rub.  No murmur heard. Pulmonary/Chest:  Effort normal and breath sounds normal. She has no decreased breath sounds. She has no wheezes. She has no rhonchi. She has no rales. She exhibits tenderness.  Lymphadenopathy:    She has no cervical adenopathy.  Neurological: She is alert and oriented to person, place, and time.  Skin: Skin is warm and dry.  Psychiatric: She has a normal mood and affect. Her behavior is normal. Judgment normal.     UC Treatments / Results  Labs (  all labs ordered are listed, but only abnormal results are displayed) Labs Reviewed - No data to display  EKG  EKG Interpretation None       Radiology Dg Chest 2 View  Result Date: 09/29/2017 CLINICAL DATA:  Nasal drainage, productive cough, sore throat, shortness of breath, chills, diaphoresis for the past 2 weeks. Chest discomfort between the scapulae. EXAM: CHEST  2 VIEW COMPARISON:  Chest x-ray of December 13, 2016 FINDINGS: The lungs are adequately inflated and clear. The heart and pulmonary vascularity are normal. The mediastinum is normal in width. There is no pleural effusion. There is multilevel degenerative endplate spurring of the mid and lower thoracic spine. IMPRESSION: There is no pneumonia nor other acute cardiopulmonary abnormality. Electronically Signed   By: David  Martinique M.D.   On: 09/29/2017 14:22    Procedures Procedures (including critical care time)  Medications Ordered in UC Medications - No data to display   Initial Impression / Assessment and Plan / UC Course  I have reviewed the triage vital signs and the nursing notes.  Pertinent labs & imaging results that were available during my care of the patient were reviewed by me and considered in my medical decision making (see chart for details).    CXR negative for pneumonia. Start azithromycin as directed. Other symptomatic treatment discussed. Patient worries about influenza, discussed symptoms and presentation inconsistent with influenza. Also discussed given symptom onset 2  weeks ago, out of treatment range with Tamiflu even if symptoms were consistent with influenza. Patient expresses understanding and agrees to plan. Return precautions given.   Final Clinical Impressions(s) / UC Diagnoses   Final diagnoses:  Bronchitis    ED Discharge Orders        Ordered    azithromycin (ZITHROMAX) 250 MG tablet  Daily     09/29/17 1432    fluticasone (FLONASE) 50 MCG/ACT nasal spray  Daily     09/29/17 1432    cetirizine-pseudoephedrine (ZYRTEC-D) 5-120 MG tablet  Daily     09/29/17 1432        Ok Edwards, PA-C 09/29/17 1702

## 2017-09-29 NOTE — ED Triage Notes (Signed)
Pt reports nasal drainage, cough, sore throat, chest discomfort, and chills for over two weeks.  Pt has been taking DayQuil, tylenol, and a homeopathic medication with no relief.

## 2017-11-18 ENCOUNTER — Other Ambulatory Visit: Payer: Self-pay | Admitting: Family Medicine

## 2017-11-18 DIAGNOSIS — N644 Mastodynia: Secondary | ICD-10-CM

## 2017-11-19 ENCOUNTER — Other Ambulatory Visit: Payer: Self-pay | Admitting: Family Medicine

## 2017-11-19 DIAGNOSIS — M79602 Pain in left arm: Secondary | ICD-10-CM

## 2017-11-24 ENCOUNTER — Ambulatory Visit
Admission: RE | Admit: 2017-11-24 | Discharge: 2017-11-24 | Disposition: A | Discharge status: Home / Self Care | Payer: BLUE CROSS/BLUE SHIELD | Source: Ambulatory Visit | Attending: Family Medicine | Admitting: Family Medicine

## 2017-11-24 DIAGNOSIS — N644 Mastodynia: Secondary | ICD-10-CM

## 2018-01-28 ENCOUNTER — Other Ambulatory Visit: Payer: Self-pay | Admitting: Urology

## 2018-01-29 ENCOUNTER — Other Ambulatory Visit: Payer: Self-pay

## 2018-01-29 ENCOUNTER — Encounter (HOSPITAL_BASED_OUTPATIENT_CLINIC_OR_DEPARTMENT_OTHER): Payer: Self-pay | Admitting: *Deleted

## 2018-01-29 NOTE — Progress Notes (Signed)
SPOKE W/ PT VIA PHONE FOR PER-OP INTERVIEW.  NPO AFTER MN W/ EXCEPTION CLEAR LIQUIDS UNTIL 0900 (NO CREAM/MILK PRODUCTS).   ARRIVE AT 1300.  NEEDS HG. MAY TAKE TYLENOL IF NEEDED AM DOS W/ SIPS OF WATER.

## 2018-02-02 ENCOUNTER — Other Ambulatory Visit: Payer: Self-pay | Admitting: Urology

## 2018-02-04 ENCOUNTER — Encounter (HOSPITAL_BASED_OUTPATIENT_CLINIC_OR_DEPARTMENT_OTHER)
Admission: RE | Disposition: A | Discharge status: Home / Self Care | Payer: Self-pay | Source: Ambulatory Visit | Attending: Urology

## 2018-02-04 ENCOUNTER — Ambulatory Visit (HOSPITAL_BASED_OUTPATIENT_CLINIC_OR_DEPARTMENT_OTHER): Payer: BLUE CROSS/BLUE SHIELD | Admitting: Anesthesiology

## 2018-02-04 ENCOUNTER — Encounter (HOSPITAL_BASED_OUTPATIENT_CLINIC_OR_DEPARTMENT_OTHER): Payer: Self-pay

## 2018-02-04 ENCOUNTER — Ambulatory Visit (HOSPITAL_BASED_OUTPATIENT_CLINIC_OR_DEPARTMENT_OTHER)
Admission: RE | Admit: 2018-02-04 | Discharge: 2018-02-04 | Disposition: A | Discharge status: Home / Self Care | Payer: BLUE CROSS/BLUE SHIELD | Source: Ambulatory Visit | Attending: Urology | Admitting: Urology

## 2018-02-04 DIAGNOSIS — R300 Dysuria: Secondary | ICD-10-CM | POA: Diagnosis present

## 2018-02-04 DIAGNOSIS — Z8052 Family history of malignant neoplasm of bladder: Secondary | ICD-10-CM | POA: Diagnosis not present

## 2018-02-04 DIAGNOSIS — Z841 Family history of disorders of kidney and ureter: Secondary | ICD-10-CM | POA: Insufficient documentation

## 2018-02-04 DIAGNOSIS — N3281 Overactive bladder: Secondary | ICD-10-CM | POA: Diagnosis not present

## 2018-02-04 DIAGNOSIS — N329 Bladder disorder, unspecified: Secondary | ICD-10-CM | POA: Insufficient documentation

## 2018-02-04 DIAGNOSIS — R102 Pelvic and perineal pain: Secondary | ICD-10-CM | POA: Insufficient documentation

## 2018-02-04 HISTORY — DX: Frequency of micturition: R35.0

## 2018-02-04 HISTORY — DX: Other constipation: K59.09

## 2018-02-04 HISTORY — DX: Stress incontinence (female) (male): N39.3

## 2018-02-04 HISTORY — DX: Migraine, unspecified, not intractable, without status migrainosus: G43.909

## 2018-02-04 HISTORY — DX: Nocturia: R35.1

## 2018-02-04 HISTORY — DX: Urgency of urination: R39.15

## 2018-02-04 HISTORY — PX: CYSTOSCOPY WITH FULGERATION: SHX6638

## 2018-02-04 HISTORY — DX: Personal history of other endocrine, nutritional and metabolic disease: Z86.39

## 2018-02-04 HISTORY — DX: Family history of other specified conditions: Z84.89

## 2018-02-04 HISTORY — PX: CYSTOSCOPY WITH BIOPSY: SHX5122

## 2018-02-04 HISTORY — DX: Presence of spectacles and contact lenses: Z97.3

## 2018-02-04 LAB — POCT HEMOGLOBIN-HEMACUE: HEMOGLOBIN: 14.2 g/dL (ref 12.0–15.0)

## 2018-02-04 SURGERY — CYSTOSCOPY, WITH BIOPSY
Anesthesia: General | Site: Bladder

## 2018-02-04 MED ORDER — KETOROLAC TROMETHAMINE 30 MG/ML IJ SOLN
INTRAMUSCULAR | Status: AC
  Filled 2018-02-04: qty 1

## 2018-02-04 MED ORDER — FENTANYL CITRATE (PF) 100 MCG/2ML IJ SOLN
INTRAMUSCULAR | Status: DC | PRN
  Administered 2018-02-04: 50 ug via INTRAVENOUS

## 2018-02-04 MED ORDER — ONDANSETRON HCL 4 MG/2ML IJ SOLN
INTRAMUSCULAR | Status: AC
  Filled 2018-02-04: qty 2

## 2018-02-04 MED ORDER — LACTATED RINGERS IV SOLN
INTRAVENOUS | Status: DC
  Administered 2018-02-04: 12:00:00 via INTRAVENOUS
  Filled 2018-02-04: qty 1000

## 2018-02-04 MED ORDER — FENTANYL CITRATE (PF) 100 MCG/2ML IJ SOLN
INTRAMUSCULAR | Status: AC
  Filled 2018-02-04: qty 2

## 2018-02-04 MED ORDER — MIDAZOLAM HCL 2 MG/2ML IJ SOLN
INTRAMUSCULAR | Status: DC | PRN
  Administered 2018-02-04: 1 mg via INTRAVENOUS

## 2018-02-04 MED ORDER — PROPOFOL 10 MG/ML IV BOLUS
INTRAVENOUS | Status: AC
  Filled 2018-02-04: qty 20

## 2018-02-04 MED ORDER — STERILE WATER FOR IRRIGATION IR SOLN
Status: DC | PRN
  Administered 2018-02-04: 3000 mL

## 2018-02-04 MED ORDER — DEXAMETHASONE SODIUM PHOSPHATE 4 MG/ML IJ SOLN
INTRAMUSCULAR | Status: DC | PRN
  Administered 2018-02-04: 10 mg via INTRAVENOUS

## 2018-02-04 MED ORDER — MIDAZOLAM HCL 2 MG/2ML IJ SOLN
INTRAMUSCULAR | Status: AC
  Filled 2018-02-04: qty 2

## 2018-02-04 MED ORDER — LIDOCAINE HCL (CARDIAC) 20 MG/ML IV SOLN
INTRAVENOUS | Status: DC | PRN
  Administered 2018-02-04: 60 mg via INTRAVENOUS

## 2018-02-04 MED ORDER — HYDROMORPHONE HCL 1 MG/ML IJ SOLN
0.2500 mg | INTRAMUSCULAR | Status: DC | PRN
  Filled 2018-02-04: qty 0.5

## 2018-02-04 MED ORDER — GENTAMICIN SULFATE 40 MG/ML IJ SOLN
120.0000 mg | INTRAMUSCULAR | Status: DC
  Administered 2018-02-04: 120 mg via INTRAVENOUS
  Filled 2018-02-04 (×2): qty 3

## 2018-02-04 MED ORDER — LIDOCAINE 2% (20 MG/ML) 5 ML SYRINGE
INTRAMUSCULAR | Status: AC
  Filled 2018-02-04: qty 5

## 2018-02-04 MED ORDER — DEXAMETHASONE SODIUM PHOSPHATE 10 MG/ML IJ SOLN
INTRAMUSCULAR | Status: AC
  Filled 2018-02-04: qty 1

## 2018-02-04 MED ORDER — SULFAMETHOXAZOLE-TRIMETHOPRIM 800-160 MG PO TABS: 1.0000 | ORAL_TABLET | Freq: Two times a day (BID) | ORAL | 0 refills | Status: AC

## 2018-02-04 MED ORDER — METOCLOPRAMIDE HCL 5 MG/ML IJ SOLN
INTRAMUSCULAR | Status: DC | PRN
  Administered 2018-02-04: 5 mg via INTRAVENOUS

## 2018-02-04 MED ORDER — PROPOFOL 10 MG/ML IV BOLUS
INTRAVENOUS | Status: DC | PRN
  Administered 2018-02-04: 150 mg via INTRAVENOUS

## 2018-02-04 MED ORDER — METOCLOPRAMIDE HCL 5 MG/ML IJ SOLN
INTRAMUSCULAR | Status: AC
  Filled 2018-02-04: qty 2

## 2018-02-04 MED ORDER — GENTAMICIN SULFATE 40 MG/ML IJ SOLN
120.0000 mg | INTRAVENOUS | Status: DC
  Filled 2018-02-04: qty 3

## 2018-02-04 MED ORDER — PROMETHAZINE HCL 25 MG/ML IJ SOLN
6.2500 mg | INTRAMUSCULAR | Status: DC | PRN
  Filled 2018-02-04: qty 1

## 2018-02-04 MED FILL — SULFAMETHOXAZOLE-TMP DS TAB: 800-160 | 2 days supply | Qty: 4 | Fill #0

## 2018-02-04 SURGICAL SUPPLY — 23 items
BAG DRAIN URO-CYSTO SKYTR STRL (DRAIN) ×2 IMPLANT
CATH ROBINSON RED A/P 14FR (CATHETERS) IMPLANT
CATH ROBINSON RED A/P 16FR (CATHETERS) IMPLANT
CLOTH BEACON ORANGE TIMEOUT ST (SAFETY) ×2 IMPLANT
ELECT REM PT RETURN 9FT ADLT (ELECTROSURGICAL) ×2
ELECTRODE REM PT RTRN 9FT ADLT (ELECTROSURGICAL) ×1 IMPLANT
GLOVE BIO SURGEON STRL SZ7.5 (GLOVE) ×2 IMPLANT
GLOVE BIOGEL PI IND STRL 8.5 (GLOVE) ×2 IMPLANT
GLOVE BIOGEL PI INDICATOR 8.5 (GLOVE) ×2
GLOVE INDICATOR 8.5 STRL (GLOVE) ×2 IMPLANT
GOWN STRL REUS W/ TWL XL LVL3 (GOWN DISPOSABLE) ×2 IMPLANT
GOWN STRL REUS W/TWL XL LVL3 (GOWN DISPOSABLE) ×2
KIT TURNOVER CYSTO (KITS) ×2 IMPLANT
MANIFOLD NEPTUNE II (INSTRUMENTS) ×2 IMPLANT
NDL SAFETY ECLIPSE 18X1.5 (NEEDLE) IMPLANT
NEEDLE HYPO 18GX1.5 SHARP (NEEDLE)
NEEDLE HYPO 22GX1.5 SAFETY (NEEDLE) IMPLANT
NS IRRIG 500ML POUR BTL (IV SOLUTION) IMPLANT
PACK CYSTO (CUSTOM PROCEDURE TRAY) ×2 IMPLANT
SYR 20CC LL (SYRINGE) IMPLANT
SYR BULB IRRIGATION 50ML (SYRINGE) IMPLANT
TUBE CONNECTING 12X1/4 (SUCTIONS) IMPLANT
WATER STERILE IRR 3000ML UROMA (IV SOLUTION) ×2 IMPLANT

## 2018-02-04 NOTE — Transfer of Care (Signed)
Immediate Anesthesia Transfer of Care Note  Patient: Terri Montgomery  Procedure(s) Performed: CYSTOSCOPY WITH  BLADDER BIOPSY (N/A Bladder) CYSTOSCOPY WITH FULGERATION (N/A Bladder)  Patient Location: PACU  Anesthesia Type:General  Level of Consciousness: awake and patient cooperative  Airway & Oxygen Therapy: Patient Spontanous Breathing and Patient connected to nasal cannula oxygen  Post-op Assessment: Report given to RN and Post -op Vital signs reviewed and stable  Post vital signs: Reviewed and stable  Last Vitals:  Vitals Value Taken Time  BP    Temp    Pulse 68 02/04/2018  1:56 PM  Resp    SpO2 99 % 02/04/2018  1:56 PM  Vitals shown include unvalidated device data.  Last Pain:  Vitals:   02/04/18 1126  TempSrc:   PainSc: 8       Patients Stated Pain Goal: 4 (88/50/27 7412)  Complications: No apparent anesthesia complications

## 2018-02-04 NOTE — Op Note (Signed)
Date of procedure: 02/04/18  Preoperative diagnosis:  1. Bladder lesion  Postoperative diagnosis:  1. Bladder lesion  Procedure: 1. Cystoscopy 2. Bladder biopsy x4 3. Fulguration of bleeders  Surgeon: Baruch Gouty, MD  Anesthesia: General  Complications: None  Intraoperative findings: The patient had a previously seen erythematous area in the posterior portion of her bladder that was biopsied x4.  Fulguration was then completed.  There were no exophytic lesions on cystoscopy.  EBL: None  Specimens: Bladder biopsy x4 to pathology  Drains: None  Disposition: Stable to the postanesthesia care unit  Indication for procedure: [name] with history of an overactive bladder and strong family history of bladder cancer had a erythematous areas throughout the posterior wall of her bladder on cystoscopy presents today for bladder biopsy.  After reviewing the management options for treatment, the patient elected to proceed with the above surgical procedure(s). We have discussed the potential benefits and risks of the procedure, side effects of the proposed treatment, the likelihood of the patient achieving the goals of the procedure, and any potential problems that might occur during the procedure or recuperation. Informed consent has been obtained.  Description of procedure: The patient was met in the preoperative area. All risks, benefits, and indications of the procedure were described in great detail. The patient consented to the procedure. Preoperative antibiotics were given. The patient was taken to the operative theater. General anesthesia was induced per the anesthesia service. The patient was then placed in the dorsal lithotomy position and prepped and draped in the usual sterile fashion. A preoperative timeout was called.   A 21 French 30 degree cystoscope was inserted into the patient's bladder per urethra atraumatically.  Pan cystoscopy revealed no exophytic  tumors.  However the previously seen erythematous areas throughout the posterior of the wall of the bladder were visualized.  These were biopsied x4 with flexible biopsy graspers.  These were sent to pathology as one specimen.  Hemostasis was then obtained with the Bugbee electrocautery as well as the lesion was fulgurated.  No other abnormalities were seen on cystoscopy.  The patient's bladder was drained.  She was awoke from anesthesia and transferred in stable condition to the postanesthesia care unit.  Plan: Patient follow-up in 1 week to discuss her pathology results.  We will also need to address her overactive bladder symptoms which currently are poorly controlled on Myrbetriq.  Baruch Gouty, M.D.

## 2018-02-04 NOTE — Interval H&P Note (Signed)
History and Physical Interval Note:  02/04/2018 12:09 PM  Terri Montgomery  has presented today for surgery, with the diagnosis of BLADDER LESION  The various methods of treatment have been discussed with the patient and family. After consideration of risks, benefits and other options for treatment, the patient has consented to  Procedure(s): CYSTOSCOPY WITH BIOPSY (N/A) CYSTOSCOPY WITH FULGERATION (N/A) as a surgical intervention .  The patient's history has been reviewed, patient examined, no change in status, stable for surgery.  I have reviewed the patient's chart and labs.  Questions were answered to the patient's satisfaction.     Nickie Retort

## 2018-02-04 NOTE — Anesthesia Preprocedure Evaluation (Addendum)
Anesthesia Evaluation  Patient identified by MRN, date of birth, ID band Patient awake    Reviewed: Allergy & Precautions, NPO status , Patient's Chart, lab work & pertinent test results  History of Anesthesia Complications (+) PONV and history of anesthetic complications  Airway Mallampati: II  TM Distance: >3 FB Neck ROM: Full    Dental no notable dental hx.    Pulmonary neg pulmonary ROS,    Pulmonary exam normal breath sounds clear to auscultation       Cardiovascular negative cardio ROS Normal cardiovascular exam Rhythm:Regular Rate:Normal     Neuro/Psych  Headaches, negative psych ROS   GI/Hepatic Neg liver ROS, GERD  Controlled,IBS (irritable bowel syndrome)   Endo/Other  negative endocrine ROS  Renal/GU negative Renal ROS     Musculoskeletal  (+) Fibromyalgia -  Abdominal   Peds  Hematology negative hematology ROS (+)   Anesthesia Other Findings BLADDER LESION  Reproductive/Obstetrics                            Anesthesia Physical Anesthesia Plan  ASA: II  Anesthesia Plan: General   Post-op Pain Management:    Induction: Intravenous  PONV Risk Score and Plan: 4 or greater and Ondansetron, Dexamethasone, Midazolam and Treatment may vary due to age or medical condition  Airway Management Planned: LMA  Additional Equipment:   Intra-op Plan:   Post-operative Plan: Extubation in OR  Informed Consent: I have reviewed the patients History and Physical, chart, labs and discussed the procedure including the risks, benefits and alternatives for the proposed anesthesia with the patient or authorized representative who has indicated his/her understanding and acceptance.   Dental advisory given  Plan Discussed with: CRNA  Anesthesia Plan Comments:         Anesthesia Quick Evaluation

## 2018-02-04 NOTE — Anesthesia Procedure Notes (Signed)
Procedure Name: LMA Insertion Date/Time: 02/04/2018 1:29 PM Performed by: Georgeanne Nim, CRNA Pre-anesthesia Checklist: Emergency Drugs available, Patient identified, Timeout performed, Suction available and Patient being monitored Patient Re-evaluated:Patient Re-evaluated prior to induction Oxygen Delivery Method: Circle system utilized Preoxygenation: Pre-oxygenation with 100% oxygen Induction Type: IV induction LMA: LMA inserted LMA Size: 4.0 Number of attempts: 1 Placement Confirmation: positive ETCO2,  CO2 detector and breath sounds checked- equal and bilateral Tube secured with: Tape Dental Injury: Teeth and Oropharynx as per pre-operative assessment

## 2018-02-04 NOTE — Discharge Instructions (Signed)
Post Anesthesia Home Care Instructions  Activity: Get plenty of rest for the remainder of the day. A responsible individual must stay with you for 24 hours following the procedure.  For the next 24 hours, DO NOT: -Drive a car -Paediatric nurse -Drink alcoholic beverages -Take any medication unless instructed by your physician -Make any legal decisions or sign important papers.  Meals: Start with liquid foods such as gelatin or soup. Progress to regular foods as tolerated. Avoid greasy, spicy, heavy foods. If nausea and/or vomiting occur, drink only clear liquids until the nausea and/or vomiting subsides. Call your physician if vomiting continues.  Special Instructions/Symptoms: Your throat may feel dry or sore from the anesthesia or the breathing tube placed in your throat during surgery. If this causes discomfort, gargle with warm salt water. The discomfort should disappear within 24 hours.  If you had a scopolamine patch placed behind your ear for the management of post- operative nausea and/or vomiting:  1. The medication in the patch is effective for 72 hours, after which it should be removed.  Wrap patch in a tissue and discard in the trash. Wash hands thoroughly with soap and water. 2. You may remove the patch earlier than 72 hours if you experience unpleasant side effects which may include dry mouth, dizziness or visual disturbances. 3. Avoid touching the patch. Wash your hands with soap and water after contact with the patch.    Bladder Biopsy, Care After Refer to this sheet in the next few weeks. These instructions provide you with information about caring for yourself after your procedure. Your health care provider may also give you more specific instructions. Your treatment has been planned according to current medical practices, but problems sometimes occur. Call your health care provider if you have any problems or questions after your procedure. What can I expect after the  procedure? After the procedure, it is common to have:  Mild pain in your bladder or kidney area during urination.  Minor burning during urination.  Small amounts of blood in your urine.  A sudden urge to urinate.  A need to urinate more often than usual.  Follow these instructions at home: Medicines  Take over-the-counter and prescription medicines only as told by your health care provider.  If you were prescribed an antibiotic medicine, take it as told by your health care provider. Do not stop taking the antibiotic even if you start to feel better. General instructions   Take a warm bath to relieve any burning sensations around your urethra.  Hold a warm, damp washcloth over the urethral area to ease pain.  Return to your normal activities as told by your health care provider. Ask your health care provider what activities are safe for you.  Do not drive for 24 hours if you received a medicine to help you relax (sedative) during your procedure. Ask your health care provider when it is safe for you to drive.  It is your responsibility to get the results of your procedure. Ask your health care provider or the department performing the procedure when your results will be ready.  Keep all follow-up visits as told by your health care provider. This is important. Contact a health care provider if:  You have a fever.  Your symptoms do not improve within 24 hours and you continue to have: ? Burning during urination. ? Increasing amounts of blood in your urine. ? Pain during urination. ? An urgent need to urinate. ? A need to urinate more  often than usual. Get help right away if:  You have a lot of bleeding or more bleeding.  You have severe pain.  You are unable to urinate.  You have bright red blood in your urine.  You are passing blood clots in your urine.  You have a fever.  You have swelling, redness, or pain in your legs.  You have difficulty breathing. This  information is not intended to replace advice given to you by your health care provider. Make sure you discuss any questions you have with your health care provider. Document Released: 11/14/2015 Document Revised: 04/04/2016 Document Reviewed: 11/14/2015 Elsevier Interactive Patient Education  [employer].

## 2018-02-04 NOTE — H&P (Signed)
CC/HPI: CC: Dysuria, abdominal pain, and family history for cancer   HPI:  The patient is a 57 year old female presents today for follow up.   1. OAB  The patient's biggest concern is her urinary frequency of 25 minutes. Her volumes vary. She also has nocturia 3-4 times per night. She has no hesitancy or incontinence. She has no urgency. Her basic complaint is her overactive bladder symptoms. She was started on Myrbetriq for this at her last visit for these symptoms.   2. Flank and suprapubic pain  She also has significant right flank pain for the last 3-4 months. She notes suprapubic discomfort as well below her umbilicus. Nothing makes it better or worse.   She also has decreased renal function with baseline GFR of 56. She is concerned about this. She underwent a renal ultrasound which showed normal kidneys without sign of obstruction or nephrolithiasis.    She does have a family history of bladder issues in both her dad and brother which she is also very concerned by.     ALLERGIES: Demerol Imitrex Penicillin    MEDICATIONS: None   GU PSH: Hysterectomy      PSH Notes: Removal of cyst from breast   NON-GU PSH: Remove Gallbladder    GU PMH: Overactive bladder - 12/22/2017 Pelvic/perineal pain - 12/22/2017      PMH Notes:  1898-11-11 00:00:00 - Note: Normal Routine History And Physical Adult   NON-GU PMH: Cardiac murmur, unspecified GERD    FAMILY HISTORY: 1 - Son, Daughter   SOCIAL HISTORY: Marital Status: Married Preferred Language: English Current Smoking Status: Patient has never smoked.   Tobacco Use Assessment Completed: Used Tobacco in last 30 days? Has never drank.  Drinks 1 caffeinated drink per day. 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    REVIEW OF SYSTEMS:    GU Review Female:   Patient reports frequent urination, burning /pain with urination, and get up at night to urinate. Patient denies hard to postpone urination, leakage of urine, stream starts  and stops, trouble starting your stream, have to strain to urinate, and being pregnant.  Gastrointestinal (Upper):   Patient reports indigestion/ heartburn. Patient denies nausea and vomiting.  Gastrointestinal (Lower):   Patient denies diarrhea and constipation.  Constitutional:   Patient reports fever and night sweats. Patient denies weight loss and fatigue.  Skin:   Patient reports skin rash/ lesion and itching.   Eyes:   Patient reports blurred vision. Patient denies double vision.  Ears/ Nose/ Throat:   Patient reports sore throat. Patient denies sinus problems.  Hematologic/Lymphatic:   Patient denies swollen glands and easy bruising.  Cardiovascular:   Patient denies leg swelling and chest pains.  Respiratory:   Patient denies cough and shortness of breath.  Endocrine:   Patient reports excessive thirst.   Musculoskeletal:   Patient reports back pain and joint pain.   Neurological:   Patient reports dizziness. Patient denies headaches.  Psychologic:   Patient denies anxiety and depression.   VITAL SIGNS:      12/30/2017 03:07 PM  Weight 181 lb / 82.1 kg  BP 159/91 mmHg  Pulse 65 /min  Temperature 97.9 F / 36.6 C   MULTI-SYSTEM PHYSICAL EXAMINATION:    Constitutional: Well-nourished. No physical deformities. Normally developed. Good grooming.  Neck: Neck symmetrical, not swollen. Normal tracheal position.  Respiratory: No labored breathing, no use of accessory muscles.   Cardiovascular: Normal temperature, normal extremity pulses, no swelling, no varicosities.  Skin: No paleness, no jaundice, no  cyanosis. No lesion, no ulcer, no rash.  Gastrointestinal: No mass, no tenderness, no rigidity, non obese abdomen.  Eyes: Normal conjunctivae. Normal eyelids.  Ears, Nose, Mouth, and Throat: Left ear no scars, no lesions, no masses. Right ear no scars, no lesions, no masses. Nose no scars, no lesions, no masses. Normal hearing. Normal lips.  Musculoskeletal: Normal gait and station of  head and neck.     PAST DATA REVIEWED:  Source Of History:  Patient   PROCEDURES:         Flexible Cystoscopy - 52000  Risks, benefits, and some of the potential complications of the procedure were discussed at length with the patient including infection, bleeding, voiding discomfort, urinary retention, fever, chills, sepsis, and others. All questions were answered. Informed consent was obtained. Antibiotic prophylaxis was given. Sterile technique and intraurethral analgesia were used.  Meatus:  Normal size. Normal location. Normal condition.  Urethra:  No hypermobility. No leakage.  Ureteral Orifices:  Normal location. Normal size. Normal shape. Effluxed clear urine.  Bladder:  However the patient's bladder is erythematous throughout. There is no obvious tumors however. However the bladder mucosa does not appear to be completely normal.      The lower urinary tract was carefully examined. The procedure was well-tolerated and without complications. Antibiotic instructions were given. Instructions were given to call the office immediately for bloody urine, difficulty urinating, urinary retention, painful or frequent urination, fever, chills, nausea, vomiting or other illness. The patient stated that she understood these instructions and would comply with them.         Renal Ultrasound - [id number]  Right kidney  Length: 10.0 cm Depth: 5.0 cm Cortical Width: 1.6 cm Width: 4.5 cm  Left Kidney Length: 9.9 cm Depth: 4.7 cm Cortical Width: 1.6 cm Width: 4.1 cm   Left Kidney/Ureter:  Appears within normal limits  Right Kidney/Ureter:  Cystic area LP: .8x.6x.6cm  Bladder:  ? Irregular posterior bladder wall. PVR = 20.44ml               Urinalysis w/Scope - 81001 Dipstick Dipstick Cont'd Micro  Color: Yellow Bilirubin: Neg WBC/hpf: NS (Not Seen)  Appearance: Clear Ketones: Neg RBC/hpf: 0 - 2/hpf  Specific Gravity: 1.015 Blood: 1+ Bacteria: NS (Not Seen)  pH: 5.5 Protein: Neg Cystals: NS (Not  Seen)  Glucose: Neg Urobilinogen: 0.2 Casts: NS (Not Seen)    Nitrites: Neg Trichomonas: Not Present    Leukocyte Esterase: Neg Mucous: Not Present      Epithelial Cells: NS (Not Seen)      Yeast: NS (Not Seen)      Sperm: Not Present    ASSESSMENT:      ICD-10 Details  1 GU:   Overactive bladder - N32.81   2   Pelvic/perineal pain - R10.2   3   Bladder disorder, Unspec - N32.9    PLAN:            Medications Stop Meds: Myrbetriq 25 mg tablet, extended release 24 hr 1 tablet PO Daily  Start: 12/22/2017  Stop: 12/17/2018   Discontinue: 12/30/2017  - Reason: pt unsure of med did not take           Orders Labs Urine Culture          Document Letter(s):  Created for Leighton Ruff, MD   Created for Patient: Clinical Summary         Notes:   1. Bladder lesion  I discussed with the patient that she had  no sign of an obvious tumor within her bladder. She did have an inflammatory erythematous mucosa throughout though. This is likely chronic inflammation however CIS or malignancy cannot be ruled out. I recommended she undergo cystoscopy with bladder biopsy in the operating room. We discussed this in great detail. All questions were answered. She understands the risks include but are not limited to bleeding, infection, iatrogenic injury, and postoperative pain.   2. OAB  I recommended that the patient start the Myrbetriq that was prescribed at her last visit.

## 2018-02-04 NOTE — Anesthesia Postprocedure Evaluation (Signed)
Anesthesia Post Note  Patient: Terri Montgomery  Procedure(s) Performed: CYSTOSCOPY WITH  BLADDER BIOPSY (N/A Bladder) CYSTOSCOPY WITH FULGERATION (N/A Bladder)     Patient location during evaluation: PACU Anesthesia Type: General Level of consciousness: awake and alert and oriented Pain management: pain level controlled Vital Signs Assessment: post-procedure vital signs reviewed and stable Respiratory status: spontaneous breathing, nonlabored ventilation and respiratory function stable Cardiovascular status: blood pressure returned to baseline and stable Postop Assessment: no apparent nausea or vomiting Anesthetic complications: no    Last Vitals:  Vitals:   02/04/18 1415 02/04/18 1430  BP: 129/72 137/75  Pulse: (!) 55 (!) 56  Resp: 10 11  Temp:    SpO2: 100% 100%    Last Pain:  Vitals:   02/04/18 1126  TempSrc:   PainSc: 8                  Dierra Riesgo A.

## 2018-02-05 ENCOUNTER — Encounter (HOSPITAL_BASED_OUTPATIENT_CLINIC_OR_DEPARTMENT_OTHER): Payer: Self-pay | Admitting: Urology

## 2018-02-11 ENCOUNTER — Other Ambulatory Visit: Payer: Self-pay | Admitting: Family Medicine

## 2018-02-11 ENCOUNTER — Ambulatory Visit
Admission: RE | Admit: 2018-02-11 | Discharge: 2018-02-11 | Disposition: A | Discharge status: Home / Self Care | Payer: BLUE CROSS/BLUE SHIELD | Source: Ambulatory Visit | Attending: Family Medicine | Admitting: Family Medicine

## 2018-02-11 DIAGNOSIS — R1012 Left upper quadrant pain: Secondary | ICD-10-CM

## 2018-06-25 ENCOUNTER — Other Ambulatory Visit (HOSPITAL_COMMUNITY): Payer: Self-pay | Admitting: Family Medicine

## 2018-06-25 DIAGNOSIS — R109 Unspecified abdominal pain: Secondary | ICD-10-CM

## 2018-06-26 ENCOUNTER — Ambulatory Visit (HOSPITAL_COMMUNITY)
Admission: RE | Admit: 2018-06-26 | Discharge: 2018-06-26 | Disposition: A | Discharge status: Home / Self Care | Payer: BLUE CROSS/BLUE SHIELD | Source: Ambulatory Visit | Attending: Family Medicine | Admitting: Family Medicine

## 2018-06-26 DIAGNOSIS — R109 Unspecified abdominal pain: Secondary | ICD-10-CM | POA: Diagnosis present

## 2019-06-08 ENCOUNTER — Telehealth: Payer: Self-pay | Admitting: Emergency Medicine

## 2019-06-08 NOTE — Telephone Encounter (Signed)
   Covid-19 screening questions   Do you now or have you had a fever in the last 14 days? No   Do you have any respiratory symptoms of shortness of breath or cough now or in the last 14 days? no  Do you have any family members or close contacts with diagnosed or suspected Covid-19 in the past 14 days? No   Have you been tested for Covid-19 and found to be positive? no

## 2019-06-09 ENCOUNTER — Ambulatory Visit: Payer: BLUE CROSS/BLUE SHIELD | Admitting: Gastroenterology

## 2020-08-15 ENCOUNTER — Other Ambulatory Visit: Payer: BLUE CROSS/BLUE SHIELD

## 2020-08-15 DIAGNOSIS — Z20822 Contact with and (suspected) exposure to covid-19: Secondary | ICD-10-CM

## 2020-08-16 LAB — SARS-COV-2, NAA 2 DAY TAT

## 2020-08-16 LAB — NOVEL CORONAVIRUS, NAA: SARS-CoV-2, NAA: NOT DETECTED

## 2023-06-25 ENCOUNTER — Emergency Department (HOSPITAL_COMMUNITY): Payer: BLUE CROSS/BLUE SHIELD

## 2023-06-25 ENCOUNTER — Encounter (HOSPITAL_COMMUNITY): Payer: Self-pay

## 2023-06-25 ENCOUNTER — Emergency Department (HOSPITAL_COMMUNITY)
Admission: EM | Admit: 2023-06-25 | Discharge: 2023-06-25 | Disposition: A | Discharge status: Home / Self Care | Payer: BLUE CROSS/BLUE SHIELD | Attending: Emergency Medicine | Admitting: Emergency Medicine

## 2023-06-25 ENCOUNTER — Other Ambulatory Visit: Payer: Self-pay

## 2023-06-25 DIAGNOSIS — R0789 Other chest pain: Secondary | ICD-10-CM | POA: Diagnosis present

## 2023-06-25 DIAGNOSIS — Z79899 Other long term (current) drug therapy: Secondary | ICD-10-CM | POA: Diagnosis not present

## 2023-06-25 DIAGNOSIS — R778 Other specified abnormalities of plasma proteins: Secondary | ICD-10-CM | POA: Diagnosis not present

## 2023-06-25 DIAGNOSIS — I10 Essential (primary) hypertension: Secondary | ICD-10-CM | POA: Insufficient documentation

## 2023-06-25 DIAGNOSIS — R0602 Shortness of breath: Secondary | ICD-10-CM | POA: Insufficient documentation

## 2023-06-25 DIAGNOSIS — R079 Chest pain, unspecified: Secondary | ICD-10-CM

## 2023-06-25 LAB — CBC
HCT: 42.3 % (ref 36.0–46.0)
Hemoglobin: 14.1 g/dL (ref 12.0–15.0)
MCH: 30.5 pg (ref 26.0–34.0)
MCHC: 33.3 g/dL (ref 30.0–36.0)
MCV: 91.4 fL (ref 80.0–100.0)
Platelets: 292 10*3/uL (ref 150–400)
RBC: 4.63 MIL/uL (ref 3.87–5.11)
RDW: 13.6 % (ref 11.5–15.5)
WBC: 7.8 10*3/uL (ref 4.0–10.5)
nRBC: 0 % (ref 0.0–0.2)

## 2023-06-25 LAB — BASIC METABOLIC PANEL
Anion gap: 10 (ref 5–15)
BUN: 15 mg/dL (ref 8–23)
CO2: 25 mmol/L (ref 22–32)
Calcium: 10.5 mg/dL — ABNORMAL HIGH (ref 8.9–10.3)
Chloride: 102 mmol/L (ref 98–111)
Creatinine, Ser: 0.65 mg/dL (ref 0.44–1.00)
GFR, Estimated: >60 mL/min (ref >60)
Glucose, Bld: 96 mg/dL (ref 70–99)
Potassium: 3.5 mmol/L (ref 3.5–5.1)
Sodium: 137 mmol/L (ref 135–145)

## 2023-06-25 LAB — HEPATIC FUNCTION PANEL
ALT: 24 U/L (ref 0–44)
AST: 24 U/L (ref 15–41)
Albumin: 4.4 g/dL (ref 3.5–5.0)
Alkaline Phosphatase: 127 U/L — ABNORMAL HIGH (ref 38–126)
Bilirubin, Direct: <0.1 mg/dL (ref 0.0–0.2)
Total Bilirubin: 0.4 mg/dL (ref 0.3–1.2)
Total Protein: 7.5 g/dL (ref 6.5–8.1)

## 2023-06-25 LAB — TROPONIN I (HIGH SENSITIVITY)
Troponin I (High Sensitivity): 3 ng/L (ref <18)
Troponin I (High Sensitivity): 4 ng/L (ref <18)

## 2023-06-25 LAB — D-DIMER, QUANTITATIVE: D-Dimer, Quant: <0.27 ug{FEU}/mL (ref 0.00–0.50)

## 2023-06-25 LAB — BRAIN NATRIURETIC PEPTIDE: B Natriuretic Peptide: 27 pg/mL (ref 0.0–100.0)

## 2023-06-25 LAB — LIPASE, BLOOD: Lipase: 39 U/L (ref 11–51)

## 2023-06-25 MED ORDER — ALUM & MAG HYDROXIDE-SIMETH 200-200-20 MG/5ML PO SUSP
30.0000 mL | Freq: Once | ORAL | Status: AC
  Administered 2023-06-25: 30 mL via ORAL
  Filled 2023-06-25: qty 30

## 2023-06-25 NOTE — Discharge Instructions (Addendum)
It was a pleasure taking care of you today.  As discussed, all of your labs are reassuring.  Please call your cardiologist tomorrow to schedule an appointment this week for a recheck.  You may need further cardiac testing.  Please return to the ER if you develop worsening or change in your chest pain.

## 2023-06-25 NOTE — ED Provider Notes (Signed)
EMERGENCY DEPARTMENT AT [facility] Provider Note   CSN: [id number] Arrival date & time: 06/25/23  1225     History  Chief Complaint  Patient presents with   Chest Pain    Terri Montgomery is a 62 y.o. female with a past medical history significant for hyperparathyroidism, GERD, hypertension, and IBS who presents to the ED from urgent care due to chest pain that has been persistent since this morning.  Admits to left-sided chest pain described as a heaviness sensation.  Chest pain has been constant.  Took ASA this morning.  Admits to some shortness of breath (has some at baseline as well).  Denies any lower extremity edema.  No history of blood clots, recent surgeries, recent long immobilizations, and hormonal treatments.  Patient hospitalized in September 2023 due to elevated troponins which was thought to be related to demand ischemia status post reaction to IV contrast vs. Elevated blood pressure.   History obtained from patient and past medical records. No interpreter used during encounter.     Home Medications Prior to Admission medications   Medication Sig Start Date End Date Taking? Authorizing Provider  acetaminophen (TYLENOL) 500 MG tablet Take 500 mg by mouth every 6 (six) hours as needed.    [provider]  Probiotic Product (PROBIOTIC DAILY) CAPS Take 1 capsule by mouth 2 (two) times daily.    [provider]  sulfamethoxazole-trimethoprim (BACTRIM DS) 800-160 MG tablet Take 1 tablet by mouth 2 (two) times daily. 02/04/18   Hildred Laser, MD      Allergies    Contrast media [iodinated contrast media], Demerol, Imitrex [sumatriptan base], Penicillins, Tramadol, Ciprofloxacin, Zofran [ondansetron hcl], and Morphine and codeine    Review of Systems   Review of Systems  Respiratory:  Positive for shortness of breath.   Cardiovascular:  Positive for chest pain.  Gastrointestinal:  Negative for abdominal pain.    Physical  Exam Updated Vital Signs BP (!) 148/76   Pulse (!) 59   Temp 98.1 F (36.7 C)   Resp 16   Ht 5\' 2"  (1.575 m)   Wt 68.5 kg   SpO2 100%   BMI 27.62 kg/m  Physical Exam Vitals and nursing note reviewed.  Constitutional:      General: She is not in acute distress.    Appearance: She is not ill-appearing.  HENT:     Head: Normocephalic.  Eyes:     Pupils: Pupils are equal, round, and reactive to light.  Cardiovascular:     Rate and Rhythm: Normal rate and regular rhythm.     Pulses: Normal pulses.     Heart sounds: Normal heart sounds. No murmur heard.    No friction rub. No gallop.  Pulmonary:     Effort: Pulmonary effort is normal.     Breath sounds: Normal breath sounds.  Abdominal:     General: Abdomen is flat. There is no distension.     Palpations: Abdomen is soft.     Tenderness: There is no abdominal tenderness. There is no guarding or rebound.  Musculoskeletal:        General: Normal range of motion.     Cervical back: Neck supple.     Comments: No lower extremity edema. Negative homan sign  Skin:    General: Skin is warm and dry.  Neurological:     General: No focal deficit present.     Mental Status: She is alert.  Psychiatric:  Mood and Affect: Mood normal.        Behavior: Behavior normal.     ED Results / Procedures / Treatments   Labs (all labs ordered are listed, but only abnormal results are displayed) Labs Reviewed  BASIC METABOLIC PANEL - Abnormal; Notable for the following components:      Result Value   Calcium 10.5 (*)    All other components within normal limits  HEPATIC FUNCTION PANEL - Abnormal; Notable for the following components:   Alkaline Phosphatase 127 (*)    All other components within normal limits  CBC  BRAIN NATRIURETIC PEPTIDE  LIPASE, BLOOD  D-DIMER, QUANTITATIVE (NOT AT [facility])  TROPONIN I (HIGH SENSITIVITY)  TROPONIN I (HIGH SENSITIVITY)    EKG EKG Interpretation Date/Time:  Wednesday June 25 2023 12:36:42  EDT Ventricular Rate:  59 PR Interval:  139 QRS Duration:  81 QT Interval:  423 QTC Calculation: 419 R Axis:   39  Text Interpretation: Sinus rhythm No significant change since last tracing Confirmed by Lorre Nick ([id number]) on 06/25/2023 5:51:59 PM  Radiology DG Chest 2 View  Result Date: 06/25/2023 CLINICAL DATA:  Chest pain EXAM: CHEST - 2 VIEW COMPARISON:  02/16/2018 FINDINGS: The heart size and mediastinal contours are within normal limits. Both lungs are clear. Disc degenerative disease of the thoracic spine. IMPRESSION: No acute abnormality of the lungs. Electronically Signed   By: Jearld Lesch M.D.   On: 06/25/2023 14:25    Procedures Procedures    Medications Ordered in ED Medications  alum & mag hydroxide-simeth (MAALOX/MYLANTA) 200-200-20 MG/5ML suspension 30 mL (30 mLs Oral Given 06/25/23 1547)    ED Course/ Medical Decision Making/ A&P                                 Medical Decision Making Amount and/or Complexity of Data Reviewed Independent Historian: spouse External Data Reviewed: notes. Labs: ordered. Decision-making details documented in ED Course. Radiology: ordered and independent interpretation performed. Decision-making details documented in ED Course. ECG/medicine tests: ordered and independent interpretation performed. Decision-making details documented in ED Course.  Risk OTC drugs.   This patient presents to the ED for concern of CP, this involves an extensive number of treatment options, and is a complaint that carries with it a high risk of complications and morbidity.  The differential diagnosis includes ACS, PE, aortic dissection, GI etiology, Msk etiology, etc  This patient presents to the ED for concern of chest pain, this involves an extensive number of treatment options, and is a complaint that carries with it a high risk of complications and morbidity.  The differential diagnosis includes ACS, PE, aortic dissection, GI etiology, MSK  etiology, etc  62 year old female presents to the ED due to chest pain that started this morning.  History of elevated troponin thought to be secondary to demand ischemia status post allergic reaction to IV contrast vs elevated BP.  No further cardiac history.  Admits to some shortness of breath associated with her chest pain however, admits to some at baseline.  No lower extremity edema.  No history of blood clots.  Upon arrival patient afebrile, not tachycardic or hypoxic.  Patient in no acute distress.  Reassuring physical exam.  Lungs clear to auscultation bilaterally.  No lower extremity edema.  Negative Homan sign bilaterally.  Cardiac labs ordered to rule out ACS.  Low suspicion for PE/DVT.  No evidence of DVT on exam.  BNP  ordered to rule out CHF exacerbation however, my suspicion is low given no evidence of fluid overload on exam.  Patient does note she occasionally has reflux.  Will give GI cocktail to see if her symptoms improve.  Already took ASA prior to arrival.  Added lipase and hepatic function panel to rule out abdominal etiology.  CBC unremarkable.  No leukocytosis.  Normal hemoglobin.  Lipase normal.  Low suspicion for pancreatitis.  BNP normal.  Low suspicion for CHF.  D-dimer normal.  Low suspicion for PE.  BMP reassuring.  Mild hypercalcemia.  Normal renal function.  No major electrolyte derangements.  hepatic function panel significant for elevated alk phos of 127.  Normal AST and ALT.  Normal total bilirubin.  Troponin x 2 normal.  EKG demonstrates normal sinus rhythm.  No signs of acute ischemia. Low suspicion for ACS. Chest x-ray personally reviewed and interpreted which is negative for any signs of pneumonia, pneumothorax, or widened mediastinum.  5:49 PM reassessed patient after GI cocktail.  Still admits to some chest pressure. Discussed reassuring labs. Low suspicion for ACS, PE, or aortic dissection. Unclear etiology of chest pain. Chest pain is atypical in nature. Patient feels  comfortable going home with close cardiology follow-up. Advised patient to call her cardiologist tomorrow to schedule an appointment this week for follow-up. Advised patient to return to the ED if symptoms worsen or change. Patient stable for discharge. Strict ED precautions discussed with patient. Patient states understanding and agrees to plan. Patient discharged home in no acute distress and stable vitals  Lives at home Hx fibromyalgia, IBS, GERD No PCP       Final Clinical Impression(s) / ED Diagnoses Final diagnoses:  Nonspecific chest pain    Rx / DC Orders ED Discharge Orders     None         Jesusita Oka 06/25/23 1757    Gwyneth Sprout, MD 06/26/23 1340

## 2023-06-25 NOTE — ED Triage Notes (Signed)
Pt states that she woke up today with chest pressure/pain that refers around to her back. Pt also endorses dizziness, and fatigue. Pt was seen at Urgent care and referred to an ER.

## 2023-07-25 ENCOUNTER — Other Ambulatory Visit: Payer: Self-pay

## 2023-07-30 LAB — SURGICAL PATHOLOGY
